# Patient Record
Sex: Female | Born: 1985 | Race: Black or African American | Hispanic: No | Smoking: Current some day smoker
Health system: Southern US, Community
[De-identification: ages and names within clinical notes are randomized; demographics above are authoritative.]

## PROBLEM LIST (undated history)

## (undated) DIAGNOSIS — N83209 Unspecified ovarian cyst, unspecified side: Secondary | ICD-10-CM

## (undated) DIAGNOSIS — O039 Complete or unspecified spontaneous abortion without complication: Secondary | ICD-10-CM

## (undated) HISTORY — PX: NO PAST SURGERIES: SHX2092

---

## 2011-06-25 ENCOUNTER — Emergency Department: Payer: Self-pay | Admitting: Emergency Medicine

## 2011-11-11 ENCOUNTER — Emergency Department: Payer: Self-pay | Admitting: Emergency Medicine

## 2011-11-12 ENCOUNTER — Emergency Department: Payer: Self-pay | Admitting: Emergency Medicine

## 2011-11-12 LAB — CBC
HGB: 13.5 g/dL (ref 12.0–16.0)
MCH: 29.1 pg (ref 26.0–34.0)
MCV: 85 fL (ref 80–100)
Platelet: 206 10*3/uL (ref 150–440)
RBC: 4.63 10*6/uL (ref 3.80–5.20)
RDW: 13.6 % (ref 11.5–14.5)
WBC: 5.2 10*3/uL (ref 3.6–11.0)

## 2011-11-12 LAB — PREGNANCY, URINE: Pregnancy Test, Urine: POSITIVE m[IU]/mL

## 2011-11-12 LAB — COMPREHENSIVE METABOLIC PANEL
Albumin: 4.2 g/dL (ref 3.4–5.0)
Alkaline Phosphatase: 60 U/L (ref 50–136)
Anion Gap: 8 (ref 7–16)
Creatinine: 0.58 mg/dL — ABNORMAL LOW (ref 0.60–1.30)
EGFR (African American): >60
Osmolality: 273 (ref 275–301)
Potassium: 3.5 mmol/L (ref 3.5–5.1)
SGOT(AST): 15 U/L (ref 15–37)
SGPT (ALT): 15 U/L
Sodium: 138 mmol/L (ref 136–145)
Total Protein: 8.4 g/dL — ABNORMAL HIGH (ref 6.4–8.2)

## 2011-11-12 LAB — URINALYSIS, COMPLETE
Bilirubin,UR: NEGATIVE
Nitrite: NEGATIVE
Squamous Epithelial: 3

## 2011-11-12 LAB — LIPASE, BLOOD: Lipase: 53 U/L — ABNORMAL LOW (ref 73–393)

## 2012-07-30 ENCOUNTER — Emergency Department: Payer: Self-pay | Admitting: Emergency Medicine

## 2012-07-30 LAB — CBC
HCT: 43.9 % (ref 35.0–47.0)
MCH: 28.9 pg (ref 26.0–34.0)
MCHC: 33.3 g/dL (ref 32.0–36.0)
MCV: 87 fL (ref 80–100)
Platelet: 233 10*3/uL (ref 150–440)
RBC: 5.06 10*6/uL (ref 3.80–5.20)
RDW: 13.5 % (ref 11.5–14.5)
WBC: 4.3 10*3/uL (ref 3.6–11.0)

## 2012-07-30 LAB — COMPREHENSIVE METABOLIC PANEL
Alkaline Phosphatase: 66 U/L (ref 50–136)
Anion Gap: 7 (ref 7–16)
BUN: 13 mg/dL (ref 7–18)
Bilirubin,Total: 0.5 mg/dL (ref 0.2–1.0)
Chloride: 110 mmol/L — ABNORMAL HIGH (ref 98–107)
Co2: 22 mmol/L (ref 21–32)
Creatinine: 0.75 mg/dL (ref 0.60–1.30)
EGFR (Non-African Amer.): >60
Osmolality: 277 (ref 275–301)
Potassium: 3.9 mmol/L (ref 3.5–5.1)
SGOT(AST): 19 U/L (ref 15–37)
SGPT (ALT): 18 U/L (ref 12–78)
Sodium: 139 mmol/L (ref 136–145)
Total Protein: 8.8 g/dL — ABNORMAL HIGH (ref 6.4–8.2)

## 2012-07-30 LAB — URINALYSIS, COMPLETE
Bilirubin,UR: NEGATIVE
Ketone: NEGATIVE
Nitrite: NEGATIVE
Ph: 6 (ref 4.5–8.0)
Protein: NEGATIVE
RBC,UR: 1 /HPF (ref 0–5)
WBC UR: 7 /HPF (ref 0–5)

## 2012-11-23 ENCOUNTER — Emergency Department: Payer: Self-pay | Admitting: Emergency Medicine

## 2013-10-27 ENCOUNTER — Emergency Department: Payer: Self-pay | Admitting: Emergency Medicine

## 2013-10-27 LAB — URINALYSIS, COMPLETE
Bilirubin,UR: NEGATIVE
Blood: NEGATIVE
GLUCOSE, UR: NEGATIVE mg/dL (ref 0–75)
NITRITE: NEGATIVE
Ph: 6 (ref 4.5–8.0)
Protein: NEGATIVE
Specific Gravity: 1.025 (ref 1.003–1.030)
Squamous Epithelial: 2
WBC UR: 11 /HPF (ref 0–5)

## 2013-10-27 LAB — CBC WITH DIFFERENTIAL/PLATELET
Basophil #: 0 10*3/uL (ref 0.0–0.1)
Basophil %: 0.5 %
EOS ABS: 0.2 10*3/uL (ref 0.0–0.7)
Eosinophil %: 3.9 %
HCT: 40.6 % (ref 35.0–47.0)
HGB: 13.2 g/dL (ref 12.0–16.0)
LYMPHS PCT: 29.8 %
Lymphocyte #: 1.8 10*3/uL (ref 1.0–3.6)
MCH: 28 pg (ref 26.0–34.0)
MCHC: 32.5 g/dL (ref 32.0–36.0)
MCV: 86 fL (ref 80–100)
Monocyte #: 0.7 x10 3/mm (ref 0.2–0.9)
Monocyte %: 10.7 %
Neutrophil #: 3.4 10*3/uL (ref 1.4–6.5)
Neutrophil %: 55.1 %
PLATELETS: 199 10*3/uL (ref 150–440)
RBC: 4.72 10*6/uL (ref 3.80–5.20)
RDW: 14.2 % (ref 11.5–14.5)
WBC: 6.2 10*3/uL (ref 3.6–11.0)

## 2013-10-27 LAB — COMPREHENSIVE METABOLIC PANEL
ALBUMIN: 3.7 g/dL (ref 3.4–5.0)
ALT: 14 U/L (ref 12–78)
ANION GAP: 5 — AB (ref 7–16)
Alkaline Phosphatase: 55 U/L
BILIRUBIN TOTAL: 0.7 mg/dL (ref 0.2–1.0)
BUN: 9 mg/dL (ref 7–18)
CALCIUM: 8.7 mg/dL (ref 8.5–10.1)
CHLORIDE: 107 mmol/L (ref 98–107)
Co2: 25 mmol/L (ref 21–32)
Creatinine: 0.79 mg/dL (ref 0.60–1.30)
EGFR (African American): >60
EGFR (Non-African Amer.): >60
Glucose: 89 mg/dL (ref 65–99)
OSMOLALITY: 272 (ref 275–301)
Potassium: 3.4 mmol/L — ABNORMAL LOW (ref 3.5–5.1)
SGOT(AST): 13 U/L — ABNORMAL LOW (ref 15–37)
Sodium: 137 mmol/L (ref 136–145)
Total Protein: 7.7 g/dL (ref 6.4–8.2)

## 2013-10-27 LAB — LIPASE, BLOOD: LIPASE: 90 U/L (ref 73–393)

## 2013-10-28 LAB — HCG, QUANTITATIVE, PREGNANCY: Beta Hcg, Quant.: <1 m[IU]/mL — ABNORMAL LOW

## 2013-10-28 LAB — WET PREP, GENITAL

## 2013-10-28 LAB — GC/CHLAMYDIA PROBE AMP

## 2013-11-21 ENCOUNTER — Ambulatory Visit: Payer: Self-pay

## 2013-11-21 LAB — URINALYSIS, COMPLETE
BACTERIA: NEGATIVE
Bilirubin,UR: NEGATIVE
Glucose,UR: NEGATIVE mg/dL (ref 0–75)
KETONE: NEGATIVE
Nitrite: NEGATIVE
PH: 7.5 (ref 4.5–8.0)
Protein: NEGATIVE
Specific Gravity: 1.005 (ref 1.003–1.030)

## 2013-11-21 LAB — COMPREHENSIVE METABOLIC PANEL
ALBUMIN: 4 g/dL (ref 3.4–5.0)
ALT: 17 U/L (ref 12–78)
AST: 12 U/L — AB (ref 15–37)
Alkaline Phosphatase: 55 U/L
Anion Gap: 11 (ref 7–16)
BUN: 7 mg/dL (ref 7–18)
Bilirubin,Total: 0.6 mg/dL (ref 0.2–1.0)
CHLORIDE: 103 mmol/L (ref 98–107)
Calcium, Total: 8.8 mg/dL (ref 8.5–10.1)
Co2: 24 mmol/L (ref 21–32)
Creatinine: 0.75 mg/dL (ref 0.60–1.30)
EGFR (African American): >60
EGFR (Non-African Amer.): >60
Glucose: 93 mg/dL (ref 65–99)
Osmolality: 273 (ref 275–301)
Potassium: 3.6 mmol/L (ref 3.5–5.1)
SODIUM: 138 mmol/L (ref 136–145)
Total Protein: 7.8 g/dL (ref 6.4–8.2)

## 2013-11-21 LAB — CBC WITH DIFFERENTIAL/PLATELET
BASOS ABS: 0 10*3/uL (ref 0.0–0.1)
Basophil %: 0.8 %
EOS PCT: 5.7 %
Eosinophil #: 0.3 10*3/uL (ref 0.0–0.7)
HCT: 42.7 % (ref 35.0–47.0)
HGB: 14.1 g/dL (ref 12.0–16.0)
Lymphocyte #: 2.5 10*3/uL (ref 1.0–3.6)
Lymphocyte %: 42.4 %
MCH: 28.4 pg (ref 26.0–34.0)
MCHC: 33 g/dL (ref 32.0–36.0)
MCV: 86 fL (ref 80–100)
MONO ABS: 0.4 x10 3/mm (ref 0.2–0.9)
Monocyte %: 7.4 %
NEUTROS PCT: 43.7 %
Neutrophil #: 2.5 10*3/uL (ref 1.4–6.5)
Platelet: 224 10*3/uL (ref 150–440)
RBC: 4.97 10*6/uL (ref 3.80–5.20)
RDW: 14.4 % (ref 11.5–14.5)
WBC: 5.8 10*3/uL (ref 3.6–11.0)

## 2013-11-21 LAB — WET PREP, GENITAL

## 2013-11-21 LAB — PREGNANCY, URINE: Pregnancy Test, Urine: NEGATIVE m[IU]/mL

## 2013-11-22 LAB — GC/CHLAMYDIA PROBE AMP

## 2013-11-23 ENCOUNTER — Ambulatory Visit: Payer: Self-pay

## 2013-11-23 LAB — URINE CULTURE

## 2015-05-27 ENCOUNTER — Emergency Department
Admission: EM | Admit: 2015-05-27 | Discharge: 2015-05-27 | Disposition: A | Discharge status: Home / Self Care | Payer: Medicaid Other | Attending: Emergency Medicine | Admitting: Emergency Medicine

## 2015-05-27 ENCOUNTER — Encounter: Payer: Self-pay | Admitting: Emergency Medicine

## 2015-05-27 DIAGNOSIS — Z3202 Encounter for pregnancy test, result negative: Secondary | ICD-10-CM | POA: Diagnosis not present

## 2015-05-27 DIAGNOSIS — R1032 Left lower quadrant pain: Secondary | ICD-10-CM | POA: Diagnosis present

## 2015-05-27 DIAGNOSIS — N390 Urinary tract infection, site not specified: Secondary | ICD-10-CM | POA: Diagnosis not present

## 2015-05-27 DIAGNOSIS — N739 Female pelvic inflammatory disease, unspecified: Secondary | ICD-10-CM

## 2015-05-27 DIAGNOSIS — Z720 Tobacco use: Secondary | ICD-10-CM | POA: Diagnosis not present

## 2015-05-27 LAB — URINALYSIS COMPLETE WITH MICROSCOPIC (ARMC ONLY)
BILIRUBIN URINE: NEGATIVE
Glucose, UA: NEGATIVE mg/dL
Ketones, ur: NEGATIVE mg/dL
Nitrite: NEGATIVE
Protein, ur: NEGATIVE mg/dL
SPECIFIC GRAVITY, URINE: 1.014 (ref 1.005–1.030)
pH: 8 (ref 5.0–8.0)

## 2015-05-27 LAB — WET PREP, GENITAL
Clue Cells Wet Prep HPF POC: NONE SEEN
TRICH WET PREP: NONE SEEN
YEAST WET PREP: NONE SEEN

## 2015-05-27 LAB — CHLAMYDIA/NGC RT PCR (ARMC ONLY)
Chlamydia Tr: NOT DETECTED
N gonorrhoeae: NOT DETECTED

## 2015-05-27 LAB — POCT PREGNANCY, URINE: Preg Test, Ur: NEGATIVE

## 2015-05-27 MED ORDER — CEFTRIAXONE SODIUM 1 G IJ SOLR
1.0000 g | Freq: Once | INTRAMUSCULAR | Status: AC
  Administered 2015-05-27: 1 g via INTRAMUSCULAR
  Filled 2015-05-27: qty 10

## 2015-05-27 MED ORDER — CEPHALEXIN 500 MG PO CAPS: 500.0000 mg | ORAL_CAPSULE | Freq: Once | ORAL | Status: DC

## 2015-05-27 MED ORDER — ONDANSETRON 4 MG PO TBDP
4.0000 mg | ORAL_TABLET | Freq: Once | ORAL | Status: AC
  Administered 2015-05-27: 4 mg via ORAL

## 2015-05-27 MED ORDER — AZITHROMYCIN 250 MG PO TABS
1000.0000 mg | ORAL_TABLET | Freq: Once | ORAL | Status: AC
  Administered 2015-05-27: 1000 mg via ORAL
  Filled 2015-05-27: qty 4

## 2015-05-27 MED ORDER — ONDANSETRON 4 MG PO TBDP
ORAL_TABLET | ORAL | Status: AC
  Filled 2015-05-27: qty 1

## 2015-05-27 MED ORDER — ACETAMINOPHEN 325 MG PO TABS
650.0000 mg | ORAL_TABLET | Freq: Once | ORAL | Status: AC
  Administered 2015-05-27: 650 mg via ORAL
  Filled 2015-05-27: qty 2

## 2015-05-27 MED ORDER — CEPHALEXIN 500 MG PO CAPS: 500.0000 mg | ORAL_CAPSULE | Freq: Two times a day (BID) | ORAL | Status: DC

## 2015-05-27 MED ORDER — LIDOCAINE HCL (PF) 1 % IJ SOLN
INTRAMUSCULAR | Status: AC
  Administered 2015-05-27: 2.1 mL
  Filled 2015-05-27: qty 5

## 2015-05-27 NOTE — Discharge Instructions (Signed)
Your evaluate for lower abdominal discomfort, and you aren't being treated for pelvic inflammatory disease and were given antibiotic here in the emergency department for this. You're also being treated for a urinary tract infection. You're being treated with antibiotic called Keflex. Return to the emergency department for any worsening condition including worsening abdominal pain, black or bloody stools, vomiting and cannot keep her medications down, fever, inability to urinate, or any other symptoms concerning to you.   Urinary Tract Infection Urinary tract infections (UTIs) can develop anywhere along your urinary tract. Your urinary tract is your body's drainage system for removing wastes and extra water. Your urinary tract includes two kidneys, two ureters, a bladder, and a urethra. Your kidneys are a pair of bean-shaped organs. Each kidney is about the size of your fist. They are located below your ribs, one on each side of your spine. CAUSES Infections are caused by microbes, which are microscopic organisms, including fungi, viruses, and bacteria. These organisms are so small that they can only be seen through a microscope. Bacteria are the microbes that most commonly cause UTIs. SYMPTOMS  Symptoms of UTIs may vary by age and gender of the patient and by the location of the infection. Symptoms in young women typically include a frequent and intense urge to urinate and a painful, burning feeling in the bladder or urethra during urination. Older women and men are more likely to be tired, shaky, and weak and have muscle aches and abdominal pain. A fever may mean the infection is in your kidneys. Other symptoms of a kidney infection include pain in your back or sides below the ribs, nausea, and vomiting. DIAGNOSIS To diagnose a UTI, your caregiver will ask you about your symptoms. Your caregiver will also ask you to provide a urine sample. The urine sample will be tested for bacteria and white blood  cells. White blood cells are made by your body to help fight infection. TREATMENT  Typically, UTIs can be treated with medication. Because most UTIs are caused by a bacterial infection, they usually can be treated with the use of antibiotics. The choice of antibiotic and length of treatment depend on your symptoms and the type of bacteria causing your infection. HOME CARE INSTRUCTIONS  If you were prescribed antibiotics, take them exactly as your caregiver instructs you. Finish the medication even if you feel better after you have only taken some of the medication.  Drink enough water and fluids to keep your urine clear or pale yellow.  Avoid caffeine, tea, and carbonated beverages. They tend to irritate your bladder.  Empty your bladder often. Avoid holding urine for long periods of time.  Empty your bladder before and after sexual intercourse.  After a bowel movement, women should cleanse from front to back. Use each tissue only once. SEEK MEDICAL CARE IF:   You have back pain.  You develop a fever.  Your symptoms do not begin to resolve within 3 days. SEEK IMMEDIATE MEDICAL CARE IF:   You have severe back pain or lower abdominal pain.  You develop chills.  You have nausea or vomiting.  You have continued burning or discomfort with urination. MAKE SURE YOU:   Understand these instructions.  Will watch your condition.  Will get help right away if you are not doing well or get worse.   This information is not intended to replace advice given to you by your health care provider. Make sure you discuss any questions you have with your health care provider.  Document Released: 04/30/2005 Document Revised: 04/11/2015 Document Reviewed: 08/29/2011 Elsevier Interactive Patient Education 2016 [employer]    Pelvic Inflammatory Disease Pelvic inflammatory disease (PID) is an infection in some or all of the female organs. PID can be in the uterus, ovaries, fallopian tubes,  or the surrounding tissues that are inside the lower belly area (pelvis). PID can lead to lasting problems if it is not treated. To check for this disease, your doctor may:  Do a physical exam.  Do blood tests, urine tests, or a pregnancy test.  Look at your vaginal discharge.  Do tests to look inside the pelvis.  Test you for other infections. HOME CARE  Take over-the-counter and prescription medicines only as told by your doctor.  If you were prescribed an antibiotic medicine, take it as told by your doctor. Do not stop taking it even if you start to feel better.  Do not have sex until treatment is done or as told by your doctor.  Tell your sex partner if you have PID. Your partner may need to be treated.  Keep all follow-up visits as told by your doctor. This is important.  Your doctor may test you for infection again 3 months after you are treated. GET HELP IF:  You have more fluid (discharge) coming from your vagina or fluid that is not normal.  Your pain does not improve.  You throw up (vomit).  You have a fever.  You cannot take your medicines.  Your partner has a sexually transmitted disease (STD).  You have pain when you pee (urinate). GET HELP RIGHT AWAY IF:  You have more belly (abdominal) or lower belly pain.  You have chills.  You are not better after 72 hours.   This information is not intended to replace advice given to you by your health care provider. Make sure you discuss any questions you have with your health care provider.   Document Released: 10/17/2008 Document Revised: 04/11/2015 Document Reviewed: 08/28/2014 Elsevier Interactive Patient Education [employer]

## 2015-05-27 NOTE — ED Notes (Signed)
Pt presents with pain and pressure in her rectum and states she feels like she needs to poop but cant.

## 2015-05-27 NOTE — ED Provider Notes (Signed)
[facility] Emergency Department Provider Note   ____________________________________________  Time seen: 8:25am I have reviewed the triage vital signs and the triage nursing note.  HISTORY  Chief Complaint Abdominal Pain   Historian Patient  HPI Allison Blankenship is a 29 y.o. female who is here for evaluation of lower abdominal pain since last night. She's had some increased frequency of urination and dysuria. Pain is located in the lower abdomen and left side. This morning she had a soft bowel movement which came out easily, but states that it hurts a little bit. No black or bloody stools. No vomiting. No fever. No vaginal discharge.    History reviewed. No pertinent past medical history.  There are no active problems to display for this patient.   History reviewed. No pertinent past surgical history.  Current Outpatient Rx  Name  Route  Sig  Dispense  Refill  . cephALEXin (KEFLEX) 500 MG capsule   Oral   Take 1 capsule (500 mg total) by mouth once.   14 capsule   0     Allergies Review of patient's allergies indicates no known allergies. "cillin" No family history on file.  Social History Social History  Substance Use Topics  . Smoking status: Current Some Day Smoker  . Smokeless tobacco: None  . Alcohol Use: No    Review of Systems  Constitutional: Negative for fever. Eyes: Negative for visual changes. ENT: Negative for sore throat. Cardiovascular: Negative for chest pain. Respiratory: Negative for shortness of breath. Gastrointestinal: Negative for vomiting and diarrhea. Genitourinary: Positive for dysuria. Musculoskeletal: Negative for back pain. Skin: Negative for rash. Neurological: Negative for headache. 10 point Review of Systems otherwise negative ____________________________________________   PHYSICAL EXAM:  VITAL SIGNS: ED Triage Vitals  Enc Vitals Group     BP 05/27/15 0757 130/78 mmHg     Pulse Rate 05/27/15  0757 102     Resp 05/27/15 0757 20     Temp 05/27/15 0757 99.2 F (37.3 C)     Temp Source 05/27/15 0757 Oral     SpO2 05/27/15 0757 92 %     Weight 05/27/15 0751 175 lb (79.379 kg)     Height 05/27/15 0751  (1.626 m)     Head Cir --      Peak Flow --      Pain Score 05/27/15 0751 10     Pain Loc --      Pain Edu? --      Excl. in GC? --      Constitutional: Alert and oriented. Well appearing and in no distress. Eyes: Conjunctivae are normal. PERRL. Normal extraocular movements. ENT   Head: Normocephalic and atraumatic.   Nose: No congestion/rhinnorhea.   Mouth/Throat: Mucous membranes are moist.   Neck: No stridor. Cardiovascular/Chest: Normal rate, regular rhythm.  No murmurs, rubs, or gallops. Respiratory: Normal respiratory effort without tachypnea nor retractions. Breath sounds are clear and equal bilaterally. No wheezes/rales/rhonchi. Gastrointestinal: Soft. No distention, no guarding, no rebound. No McBurney's point tenderness. Suprapubic tenderness mild to moderate and left lower quadrant tenderness mild to moderate.  Genitourinary/rectal: Moderate yellow discharge. Positive cervical motion tenderness. No adnexal mass. Musculoskeletal: Nontender with normal range of motion in all extremities. No joint effusions.  No lower extremity tenderness.  No edema. Neurologic:  Normal speech and language. No gross or focal neurologic deficits are appreciated. Skin:  Skin is warm, dry and intact. No rash noted. Psychiatric: Mood and affect are normal. Speech and behavior are  normal. Patient exhibits appropriate insight and judgment.  ____________________________________________   EKG I, Governor Rooksebecca Ruqaya Strauss, MD, the attending physician have personally viewed and interpreted all ECGs.  No EKG performed ____________________________________________  LABS (pertinent positives/negatives)  Urine pregnancy test negative Urinalysis 1+ leukocytes, 6-30 white blood cells and  rare bacteria. Wet prep:  moderate white blood cells, no clue cells, no trichomoniasis, no yeast ____________________________________________  RADIOLOGY All Xrays were viewed by me. Imaging interpreted by Radiologist.   __________________________________________  PROCEDURES  Procedure(s) performed: None  Critical Care performed: None  ____________________________________________   ED COURSE / ASSESSMENT AND PLAN  CONSULTATIONS: None  Pertinent labs & imaging results that were available during my care of the patient were reviewed by me and considered in my medical decision making (see chart for details).   Patient initially told nurse she had some pain with bowel movement, however for me her pain is suprapubic along with dysuria. Urinalysis is consistent with urinary tract infection.  Pelvic exam consistent with cervicitis/pelvic inflammatory disease. Patient was treated with IM Rocephin and azithromycin here in the emergency Department. Patient was given 1 g IM of Rocephin to cover also for initial UTI. Zithromax 1 g by mouth was given.  Patient / Family / Caregiver informed of clinical course, medical decision-making process, and agree with plan.   I discussed return precautions, follow-up instructions, and discharged instructions with patient and/or family.  ___________________________________________   FINAL CLINICAL IMPRESSION(S) / ED DIAGNOSES   Final diagnoses:  Urinary tract infection without hematuria, site unspecified  Pelvic inflammatory disease       Governor Rooksebecca Eleanor Gatliff, MD 05/27/15 [phone]

## 2015-05-28 LAB — URINE CULTURE

## 2015-07-20 ENCOUNTER — Emergency Department
Admission: EM | Admit: 2015-07-20 | Discharge: 2015-07-20 | Disposition: A | Discharge status: Home / Self Care | Payer: Medicaid Other | Attending: Emergency Medicine | Admitting: Emergency Medicine

## 2015-07-20 ENCOUNTER — Encounter: Payer: Self-pay | Admitting: Emergency Medicine

## 2015-07-20 DIAGNOSIS — T148XXA Other injury of unspecified body region, initial encounter: Secondary | ICD-10-CM

## 2015-07-20 DIAGNOSIS — S20219A Contusion of unspecified front wall of thorax, initial encounter: Secondary | ICD-10-CM | POA: Diagnosis not present

## 2015-07-20 DIAGNOSIS — F17200 Nicotine dependence, unspecified, uncomplicated: Secondary | ICD-10-CM | POA: Diagnosis not present

## 2015-07-20 DIAGNOSIS — Z792 Long term (current) use of antibiotics: Secondary | ICD-10-CM | POA: Insufficient documentation

## 2015-07-20 DIAGNOSIS — Y9389 Activity, other specified: Secondary | ICD-10-CM | POA: Insufficient documentation

## 2015-07-20 DIAGNOSIS — S40011A Contusion of right shoulder, initial encounter: Secondary | ICD-10-CM | POA: Diagnosis not present

## 2015-07-20 DIAGNOSIS — Y92410 Unspecified street and highway as the place of occurrence of the external cause: Secondary | ICD-10-CM | POA: Diagnosis not present

## 2015-07-20 DIAGNOSIS — Y998 Other external cause status: Secondary | ICD-10-CM | POA: Diagnosis not present

## 2015-07-20 DIAGNOSIS — S4991XA Unspecified injury of right shoulder and upper arm, initial encounter: Secondary | ICD-10-CM | POA: Diagnosis present

## 2015-07-20 MED ORDER — IBUPROFEN 800 MG PO TABS: 800.0000 mg | ORAL_TABLET | Freq: Three times a day (TID) | ORAL | Status: DC | PRN

## 2015-07-20 MED ORDER — CYCLOBENZAPRINE HCL 10 MG PO TABS: 10.0000 mg | ORAL_TABLET | Freq: Three times a day (TID) | ORAL | Status: DC | PRN
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
## 2015-07-20 NOTE — Discharge Instructions (Signed)

## 2016-02-06 ENCOUNTER — Encounter: Payer: Self-pay | Admitting: *Deleted

## 2016-02-06 ENCOUNTER — Emergency Department
Admission: EM | Admit: 2016-02-06 | Discharge: 2016-02-06 | Disposition: A | Discharge status: Home / Self Care | Payer: MEDICAID | Attending: Emergency Medicine | Admitting: Emergency Medicine

## 2016-02-06 ENCOUNTER — Emergency Department: Payer: MEDICAID

## 2016-02-06 DIAGNOSIS — N939 Abnormal uterine and vaginal bleeding, unspecified: Secondary | ICD-10-CM

## 2016-02-06 DIAGNOSIS — Z3A01 Less than 8 weeks gestation of pregnancy: Secondary | ICD-10-CM | POA: Insufficient documentation

## 2016-02-06 DIAGNOSIS — O009 Unspecified ectopic pregnancy without intrauterine pregnancy: Secondary | ICD-10-CM | POA: Insufficient documentation

## 2016-02-06 DIAGNOSIS — F17210 Nicotine dependence, cigarettes, uncomplicated: Secondary | ICD-10-CM | POA: Insufficient documentation

## 2016-02-06 LAB — URINALYSIS COMPLETE WITH MICROSCOPIC (ARMC ONLY)
BACTERIA UA: NONE SEEN
Bilirubin Urine: NEGATIVE
GLUCOSE, UA: NEGATIVE mg/dL
Ketones, ur: NEGATIVE mg/dL
LEUKOCYTES UA: NEGATIVE
Nitrite: NEGATIVE
PROTEIN: NEGATIVE mg/dL
Specific Gravity, Urine: 1.013 (ref 1.005–1.030)
pH: 6 (ref 5.0–8.0)

## 2016-02-06 LAB — CBC WITH DIFFERENTIAL/PLATELET
Basophils Absolute: 0 10*3/uL (ref 0–0.1)
EOS ABS: 0.3 10*3/uL (ref 0–0.7)
Eosinophils Relative: 4 %
HCT: 41.5 % (ref 35.0–47.0)
HEMOGLOBIN: 14.1 g/dL (ref 12.0–16.0)
Lymphocytes Relative: 32 %
Lymphs Abs: 2.5 10*3/uL (ref 1.0–3.6)
MCH: 29.8 pg (ref 26.0–34.0)
MCHC: 34.1 g/dL (ref 32.0–36.0)
MCV: 87.3 fL (ref 80.0–100.0)
Monocytes Absolute: 0.9 10*3/uL (ref 0.2–0.9)
Monocytes Relative: 11 %
Neutro Abs: 4.1 10*3/uL (ref 1.4–6.5)
Platelets: ADEQUATE 10*3/uL (ref 150–440)
RBC: 4.75 MIL/uL (ref 3.80–5.20)
RDW: 14.3 % (ref 11.5–14.5)
WBC: 7.8 10*3/uL (ref 3.6–11.0)

## 2016-02-06 LAB — COMPREHENSIVE METABOLIC PANEL
ALBUMIN: 4.5 g/dL (ref 3.5–5.0)
ALK PHOS: 51 U/L (ref 38–126)
ALT: 15 U/L (ref 14–54)
ANION GAP: 9 (ref 5–15)
AST: 19 U/L (ref 15–41)
BUN: 6 mg/dL (ref 6–20)
CALCIUM: 8.9 mg/dL (ref 8.9–10.3)
CHLORIDE: 107 mmol/L (ref 101–111)
CO2: 21 mmol/L — AB (ref 22–32)
Creatinine, Ser: 0.66 mg/dL (ref 0.44–1.00)
GFR calc Af Amer: >60 mL/min (ref >60)
GFR calc non Af Amer: >60 mL/min (ref >60)
Glucose, Bld: 85 mg/dL (ref 65–99)
Potassium: 3.6 mmol/L (ref 3.5–5.1)
SODIUM: 137 mmol/L (ref 135–145)
Total Bilirubin: 1 mg/dL (ref 0.3–1.2)
Total Protein: 7.9 g/dL (ref 6.5–8.1)

## 2016-02-06 LAB — CHLAMYDIA/NGC RT PCR (ARMC ONLY)
Chlamydia Tr: NOT DETECTED
N GONORRHOEAE: NOT DETECTED

## 2016-02-06 LAB — WET PREP, GENITAL
Clue Cells Wet Prep HPF POC: NONE SEEN
Sperm: NONE SEEN
Trich, Wet Prep: NONE SEEN
YEAST WET PREP: NONE SEEN

## 2016-02-06 LAB — HCG, QUANTITATIVE, PREGNANCY: HCG, BETA CHAIN, QUANT, S: 178 m[IU]/mL — AB (ref <5)

## 2016-02-06 LAB — POC URINE PREG, ED: PREG TEST UR: POSITIVE — AB

## 2016-02-06 MED ORDER — ONDANSETRON HCL 4 MG/2ML IJ SOLN
4.0000 mg | Freq: Once | INTRAMUSCULAR | Status: AC
  Administered 2016-02-06: 4 mg via INTRAVENOUS

## 2016-02-06 MED ORDER — MORPHINE SULFATE (PF) 4 MG/ML IV SOLN
4.0000 mg | Freq: Once | INTRAVENOUS | Status: AC
  Administered 2016-02-06: 4 mg via INTRAVENOUS

## 2016-02-06 MED ORDER — MORPHINE SULFATE (PF) 4 MG/ML IV SOLN
INTRAVENOUS | Status: AC
  Administered 2016-02-06: 4 mg via INTRAVENOUS
  Filled 2016-02-06: qty 1

## 2016-02-06 MED ORDER — ONDANSETRON HCL 4 MG/2ML IJ SOLN
INTRAMUSCULAR | Status: AC
  Administered 2016-02-06: 4 mg via INTRAVENOUS
  Filled 2016-02-06: qty 2

## 2016-02-06 NOTE — Discharge Instructions (Signed)

## 2016-02-06 NOTE — ED Notes (Signed)
Pt complains of heavy vaginal bleeding abdominal cramping with clots,pt reports this is abnormal

## 2016-02-06 NOTE — Consult Note (Signed)
OBSTETRICS/GYNECOLOGY CONSULT HISTORY AND PHYSICAL  Reason for Consult: Suspected ectopic pregnancy Referring Physician: Dr. Lenise Arena (ER Physician)  Allison Blankenship is an 30 y.o. G2P1001 female who presented to the Emergency Room with complaints of vaginal bleeding and passage of large clots. Patient reports that she has never had passage of clots before, and so was concerned.  LMP was in June (cannot recall exact date), but was approximately 3-4 weeks ago, however notes that this cycle was different as it only lasted 1 day.  Patient currently denies nausea/vomiting, abdominal pain, abnormal vaginal discharge, fevers or chills.   Pertinent Gynecological History: Menses: flow is moderate, regular every month without intermenstrual spotting and usually lasting less than 6 days Contraception: none currently Blood transfusions: none Sexually transmitted diseases: no past history Previous GYN Procedures: none  Last pap: normal Date: patient thinks it was last year OB History: G2, P1001   Menstrual History: Menarche age: 72  Patient's last menstrual period was 12/21/2015.    History reviewed. No pertinent past medical history.  No past surgical history on file.  No family history on file.  Social History   Social History  . Marital Status: Divorced    Spouse Name: N/A  . Number of Children: N/A  . Years of Education: N/A   Occupational History  . Not on file.   Social History Main Topics  . Smoking status: Current Some Day Smoker -- 1.00 packs/day    Types: Cigarettes  . Smokeless tobacco: Not on file  . Alcohol Use: No  . Drug Use: Not on file  . Sexual Activity: Not on file   Other Topics Concern  . Not on file   Social History Narrative    Allergies: No Known Allergies  Medications: Prior to Admission:  (Not in a hospital admission)  Review of Systems  Constitutional: Negative for fever and chills.  HENT: Negative for congestion and sore throat.    Eyes: Negative.   Respiratory: Negative for cough.   Cardiovascular: Negative for chest pain, palpitations and leg swelling.  Gastrointestinal: Negative for nausea, vomiting, abdominal pain, diarrhea and constipation.  Genitourinary: Negative for dysuria, urgency and frequency.  Musculoskeletal: Negative.   Skin: Negative for itching and rash.  Neurological: Negative.  Negative for dizziness, weakness and headaches.  Endo/Heme/Allergies: Negative.   Psychiatric/Behavioral: Negative.     Blood pressure 139/83, pulse 64, temperature 98.3 F (36.8 C), temperature source Oral, resp. rate 17, height 5' 3" (1.6 m), weight 178 lb (80.74 kg), last menstrual period 12/21/2015, SpO2 98 %. Physical Exam  Constitutional: She is oriented to person, place, and time. She appears well-developed and well-nourished. No distress.  HENT:  Head: Normocephalic and atraumatic.  Eyes: EOM are normal. Pupils are equal, round, and reactive to light. No scleral icterus.  Neck: Normal range of motion. Neck supple. No JVD present. No tracheal deviation present. No thyromegaly present.  Cardiovascular: Normal rate, regular rhythm and normal heart sounds.  Exam reveals no gallop and no friction rub.   No murmur heard. Respiratory: Effort normal and breath sounds normal. No respiratory distress. She has no wheezes.  GI: Soft. Bowel sounds are normal. She exhibits no distension and no mass. There is no tenderness. There is no rebound and no guarding.  Genitourinary:  external genitalia normal, rectovaginal septum normal.  Vagina without discharge, small amount of dark red blood in vault.  Cervix normal appearing, no lesions and no motion tenderness.  Uterus mobile, nontender, normal shape and size.  Adnexae non-palpable,  nontender bilaterally.    Musculoskeletal: Normal range of motion. She exhibits no edema.  Neurological: She is alert and oriented to person, place, and time.  Skin: Skin is warm and dry. She is not  diaphoretic.  Psychiatric: She has a normal mood and affect.    Results for orders placed or performed during the hospital encounter of 02/06/16 (from the past 48 hour(s))  Urinalysis complete, with microscopic     Status: Abnormal   Collection Time: 02/06/16  1:09 PM  Result Value Ref Range   Color, Urine YELLOW (A) YELLOW   APPearance CLEAR (A) CLEAR   Glucose, UA NEGATIVE NEGATIVE mg/dL   Bilirubin Urine NEGATIVE NEGATIVE   Ketones, ur NEGATIVE NEGATIVE mg/dL   Specific Gravity, Urine 1.013 1.005 - 1.030   Hgb urine dipstick 2+ (A) NEGATIVE   pH 6.0 5.0 - 8.0   Protein, ur NEGATIVE NEGATIVE mg/dL   Nitrite NEGATIVE NEGATIVE   Leukocytes, UA NEGATIVE NEGATIVE   RBC / HPF 0-5 0 - 5 RBC/hpf   WBC, UA 0-5 0 - 5 WBC/hpf   Bacteria, UA NONE SEEN NONE SEEN   Squamous Epithelial / LPF 0-5 (A) NONE SEEN   Mucous PRESENT   POC urine preg     Status: Abnormal   Collection Time: 02/06/16  1:09 PM  Result Value Ref Range   Preg Test, Ur Positive (A) Negative  CBC with Differential     Status: None   Collection Time: 02/06/16  1:25 PM  Result Value Ref Range   WBC 7.8 3.6 - 11.0 K/uL   RBC 4.75 3.80 - 5.20 MIL/uL   Hemoglobin 14.1 12.0 - 16.0 g/dL   HCT 41.5 35.0 - 47.0 %   MCV 87.3 80.0 - 100.0 fL   MCH 29.8 26.0 - 34.0 pg   MCHC 34.1 32.0 - 36.0 g/dL   RDW 14.3 11.5 - 14.5 %   Platelets  150 - 440 K/uL    PLATELET CLUMPS NOTED ON SMEAR, COUNT APPEARS ADEQUATE   Neutrophils Relative % 52% %   Neutro Abs 4.1 1.4 - 6.5 K/uL   Lymphocytes Relative 32% %   Lymphs Abs 2.5 1.0 - 3.6 K/uL   Monocytes Relative 11% %   Monocytes Absolute 0.9 0.2 - 0.9 K/uL   Eosinophils Relative 4% %   Eosinophils Absolute 0.3 0 - 0.7 K/uL   Basophils Relative 1% %   Basophils Absolute 0.0 0 - 0.1 K/uL  Comprehensive metabolic panel     Status: Abnormal   Collection Time: 02/06/16  1:25 PM  Result Value Ref Range   Sodium 137 135 - 145 mmol/L   Potassium 3.6 3.5 - 5.1 mmol/L   Chloride 107 101 -  111 mmol/L   CO2 21 (L) 22 - 32 mmol/L   Glucose, Bld 85 65 - 99 mg/dL   BUN 6 6 - 20 mg/dL   Creatinine, Ser 0.66 0.44 - 1.00 mg/dL   Calcium 8.9 8.9 - 10.3 mg/dL   Total Protein 7.9 6.5 - 8.1 g/dL   Albumin 4.5 3.5 - 5.0 g/dL   AST 19 15 - 41 U/L   ALT 15 14 - 54 U/L   Alkaline Phosphatase 51 38 - 126 U/L   Total Bilirubin 1.0 0.3 - 1.2 mg/dL   GFR calc non Af Amer >60 >60 mL/min   GFR calc Af Amer >60 >60 mL/min    Comment: (NOTE) The eGFR has been calculated using the CKD EPI equation. This calculation has  not been validated in all clinical situations. eGFR's persistently <60 mL/min signify possible Chronic Kidney Disease.    Anion gap 9 5 - 15  Wet prep, genital     Status: Abnormal   Collection Time: 02/06/16  1:25 PM  Result Value Ref Range   Yeast Wet Prep HPF POC NONE SEEN NONE SEEN   Trich, Wet Prep NONE SEEN NONE SEEN   Clue Cells Wet Prep HPF POC NONE SEEN NONE SEEN   WBC, Wet Prep HPF POC FEW (A) NONE SEEN   Sperm NONE SEEN   Chlamydia/NGC rt PCR ([facility] only)     Status: None   Collection Time: 02/06/16  1:25 PM  Result Value Ref Range   Specimen source GC/Chlam ENDOCERVICAL    Chlamydia Tr NOT DETECTED NOT DETECTED   N gonorrhoeae NOT DETECTED NOT DETECTED    Comment: (NOTE) 100  This methodology has not been evaluated in pregnant women or in 200  patients with a history of hysterectomy. 300 400  This methodology will not be performed on patients less than 84  years of age.   hCG, quantitative, pregnancy     Status: Abnormal   Collection Time: 02/06/16  1:25 PM  Result Value Ref Range   hCG, Beta Chain, Quant, S 178 (H) <5 mIU/mL    Comment:          GEST. AGE      CONC.  (mIU/mL)   <=1 WEEK        5 - 50     2 WEEKS       50 - 500     3 WEEKS       100 - 10,000     4 WEEKS     1,000 - 30,000     5 WEEKS     3,500 - 115,000   6-8 WEEKS     12,000 - 270,000    12 WEEKS     15,000 - 220,000        FEMALE AND NON-PREGNANT FEMALE:     LESS THAN 5  mIU/mL     US Ob Comp Less 14 Wks  02/06/2016  CLINICAL DATA:  Abdominal pain and vaginal bleeding. First-trimester pregnancy. Quantitative beta HCG 178. Gestational age [redacted] weeks 5 days. EXAM: OBSTETRIC <14 WK [place] AND TRANSVAGINAL OB US TECHNIQUE: Both transabdominal and transvaginal ultrasound examinations were performed for complete evaluation of the gestation as well as the maternal uterus, adnexal regions, and pelvic cul-de-sac. Transvaginal technique was performed to assess early pregnancy. COMPARISON:  11/23/2013 FINDINGS: No intrauterine gestational sac or fluid. There is an echogenic peripherally hypervascular mass in the right adnexa that is extra ovarian, 23 x 15 x 15 mm. Mass has no identifiable internal fetal structures. Symmetric normal appearance of the ovaries. No free pelvic fluid. These results were called by telephone at the time of interpretation on 02/06/2016 at 2:51 pm to Dr. Lenise Arena , who verbally acknowledged these results. IMPRESSION: Extra ovarian right adnexal mass with no intrauterine sac, concerning for ectopic pregnancy. At the current serum beta HCG level, early intrauterine pregnancy cannot be excluded. No pelvic fluid. Electronically Signed   By: Monte Fantasia M.D.   On: 02/06/2016 14:52   US Ob Transvaginal  02/06/2016  CLINICAL DATA:  Abdominal pain and vaginal bleeding. First-trimester pregnancy. Quantitative beta HCG 178. Gestational age [redacted] weeks 5 days. EXAM: OBSTETRIC <14 WK [place] AND TRANSVAGINAL OB US TECHNIQUE: Both transabdominal and transvaginal ultrasound examinations were performed  for complete evaluation of the gestation as well as the maternal uterus, adnexal regions, and pelvic cul-de-sac. Transvaginal technique was performed to assess early pregnancy. COMPARISON:  11/23/2013 FINDINGS: No intrauterine gestational sac or fluid. There is an echogenic peripherally hypervascular mass in the right adnexa that is extra ovarian, 23 x 15 x 15 mm. Mass has no  identifiable internal fetal structures. Symmetric normal appearance of the ovaries. No free pelvic fluid. These results were called by telephone at the time of interpretation on 02/06/2016 at 2:51 pm to Dr. Lenise Arena , who verbally acknowledged these results. IMPRESSION: Extra ovarian right adnexal mass with no intrauterine sac, concerning for ectopic pregnancy. At the current serum beta HCG level, early intrauterine pregnancy cannot be excluded. No pelvic fluid. Electronically Signed   By: Monte Fantasia M.D.   On: 02/06/2016 14:52    Assessment/Plan: 1. Pregnancy, cannot r/o ectopic - Patient with low BHCG (158), vaginal bleeding.  Could be early IUP with miscarriage vs cannot r/o ectopic (based on small right adnexal mass, 2 cm).  Due to low levels, unable to identify an IUP on ultrasound. Based on symptoms, I believe this is more likely an early pregnancy (with suspected miscarriage).   Discussion had with patient regarding differential diagnosis.  Per patient, she was unaware of pregnancy until today, however pregnancy is desired.  Would recommend repeating BHCG in 48 hrs.  If levels are declining, no further management needed as pregnancy will likely resolve without further intervention.  If levels rising, would recommend serial HCGs q 2-3 days until levels reach at least 1500 (if normal rise).  At this point, would then repeat scan.  Would strongly reiterate ectopic and bleeding precautions if HCG levels continued to rise.   If abnormal rise, suspicion rises for ectopic pregnancy and patient would be a candidate for Methotrexate therapy.  - Patient to f/u in clinic in 2 days for repeat BHCG.  Will notify patient of results once they return, and inform of further follow up as indicated by labs.  - Would recommend to draw ABO and Rh to assess need for Rhogam.  - Vaginal bleeding, currently hemodynamically stable.     Rubie Maid 02/06/2016

## 2016-02-06 NOTE — ED Provider Notes (Signed)
[facility] Emergency Department Provider Note        Time seen: ----------------------------------------- 1:21 PM on 02/06/2016 -----------------------------------------    I have reviewed the triage vital signs and the nursing notes.   HISTORY  Chief Complaint Vaginal Bleeding    HPI Allison Blankenship is a 30 y.o. female who presents to ER for heavy vaginal bleeding and abdominal cramping with clots today. Patient reports this is abnormal, she states in June she had a one-day menstrual cycle which is unusual for her. Patient has never passed large clots before. She denies fevers, chills or other complaints. Currently she does not have significant abdominal pain or bleeding.   History reviewed. No pertinent past medical history.  There are no active problems to display for this patient.   No past surgical history on file.  Allergies Review of patient's allergies indicates no known allergies.  Social History Social History  Substance Use Topics  . Smoking status: Current Some Day Smoker -- 1.00 packs/day    Types: Cigarettes  . Smokeless tobacco: None  . Alcohol Use: No    Review of Systems Constitutional: Negative for fever. Cardiovascular: Negative for chest pain. Respiratory: Negative for shortness of breath. Gastrointestinal: Positive for abdominal cramping Genitourinary: Negative for dysuria.Positive for vaginal bleeding Musculoskeletal: Negative for back pain. Skin: Negative for rash. Neurological: Negative for headaches, focal weakness or numbness.  10-point ROS otherwise negative.  ____________________________________________   PHYSICAL EXAM:  VITAL SIGNS: ED Triage Vitals  Enc Vitals Group     BP 02/06/16 1213 154/81 mmHg     Pulse Rate 02/06/16 1213 76     Resp 02/06/16 1213 18     Temp 02/06/16 1213 98.3 F (36.8 C)     Temp Source 02/06/16 1213 Oral     SpO2 02/06/16 1213 98 %     Weight 02/06/16 1213 178 lb (80.74  kg)     Height 02/06/16 1213 5\' 3"  (1.6 m)     Head Cir --      Peak Flow --      Pain Score 02/06/16 1207 8     Pain Loc --      Pain Edu? --      Excl. in GC? --     Constitutional: Alert and oriented. Well appearing and in no distress. Eyes: Conjunctivae are normal. PERRL. Normal extraocular movements. ENT   Head: Normocephalic and atraumatic.   Nose: No congestion/rhinnorhea.   Mouth/Throat: Mucous membranes are moist.   Neck: No stridor. Cardiovascular: Normal rate, regular rhythm. No murmurs, rubs, or gallops. Respiratory: Normal respiratory effort without tachypnea nor retractions. Breath sounds are clear and equal bilaterally. No wheezes/rales/rhonchi. Gastrointestinal: Soft and nontender. Normal bowel sounds Genitourinary: Active vaginal bleeding that is mild, clots and tissue present in the cervix. Musculoskeletal: Nontender with normal range of motion in all extremities. No lower extremity tenderness nor edema. Neurologic:  Normal speech and language. No gross focal neurologic deficits are appreciated.  Skin:  Skin is warm, dry and intact. No rash noted. Psychiatric: Mood and affect are normal. Speech and behavior are normal.  ____________________________________________  ED COURSE:  Pertinent labs & imaging results that were available during my care of the patient were reviewed by me and considered in my medical decision making (see chart for details). Patient is in no acute distress, we will assess with basic labs and ultrasound if needed. ____________________________________________    LABS (pertinent positives/negatives)  Labs Reviewed  WET PREP, GENITAL - Abnormal; Notable for  the following:    WBC, Wet Prep HPF POC FEW (*)    All other components within normal limits  COMPREHENSIVE METABOLIC PANEL - Abnormal; Notable for the following:    CO2 21 (*)    All other components within normal limits  URINALYSIS COMPLETEWITH MICROSCOPIC ([facility] ONLY) -  Abnormal; Notable for the following:    Color, Urine YELLOW (*)    APPearance CLEAR (*)    Hgb urine dipstick 2+ (*)    Squamous Epithelial / LPF 0-5 (*)    All other components within normal limits  HCG, QUANTITATIVE, PREGNANCY - Abnormal; Notable for the following:    hCG, Beta Chain, Quant, S 178 (*)    All other components within normal limits  POC URINE PREG, ED - Abnormal; Notable for the following:    Preg Test, Ur Positive (*)    All other components within normal limits  CHLAMYDIA/NGC RT PCR ([facility] ONLY)  CBC WITH DIFFERENTIAL/PLATELET  POC URINE PREG, ED    RADIOLOGY Images were viewed by me  Pregnancy ultrasound  IMPRESSION: Extra ovarian right adnexal mass with no intrauterine sac, concerning for ectopic pregnancy. At the current serum beta HCG level, early intrauterine pregnancy cannot be excluded. No pelvic fluid. ____________________________________________  FINAL ASSESSMENT AND PLAN  Vaginal bleeding in early pregnancy, possible ectopic pregnancy  Plan: Patient with labs and imaging as dictated above. Ultrasound findings as above, I will discuss with OB/GYN.  OB/GYN, Dr. Valentino Saxonherry will come and evaluate the patient in the ER for final disposition. Emily FilbertWilliams, Khara Renaud E, MD   Note: This dictation was prepared with Dragon dictation. Any transcriptional errors that result from this process are unintentional   Emily FilbertJonathan E Nashaly Dorantes, MD 02/06/16 [phone]

## 2016-02-07 ENCOUNTER — Encounter: Payer: Self-pay | Admitting: Emergency Medicine

## 2016-02-07 ENCOUNTER — Emergency Department
Admission: EM | Admit: 2016-02-07 | Discharge: 2016-02-07 | Discharge status: Against Medical Advice | Payer: MEDICAID | Attending: Emergency Medicine | Admitting: Emergency Medicine

## 2016-02-07 DIAGNOSIS — Z5320 Procedure and treatment not carried out because of patient's decision for unspecified reasons: Secondary | ICD-10-CM

## 2016-02-07 DIAGNOSIS — N939 Abnormal uterine and vaginal bleeding, unspecified: Secondary | ICD-10-CM | POA: Insufficient documentation

## 2016-02-07 DIAGNOSIS — Z5329 Procedure and treatment not carried out because of patient's decision for other reasons: Secondary | ICD-10-CM

## 2016-02-07 DIAGNOSIS — F17210 Nicotine dependence, cigarettes, uncomplicated: Secondary | ICD-10-CM | POA: Insufficient documentation

## 2016-02-07 NOTE — ED Notes (Signed)
Patient presents to the ED with vaginal bleeding and left lower quadrant abdominal pain.  Patient was seen in the ED yesterday and told she may have an ectopic pregnancy and was instructed to follow-up with OB-GYN tomorrow.  Patient states the pain has increased and is very severe and her bleeding is very heavy.  Patient is in no obvious distress at this time.

## 2016-02-07 NOTE — ED Notes (Signed)
Pt left AMA, unable to obtain pt signature.  MD aware.

## 2016-02-07 NOTE — ED Notes (Signed)
Pt in via triage with complaints of pelvic pain, vaginal bleeding.  Pt reports being seen here last night and being dx with an ectopic pregnancy with instructions to follow up with OB/GYN.  Pt reports pain and bleeding is worse today, reports saturating 5 pads w/in the last hour, passing large blood clots.  Pt A/Ox4, vitals WDL, no immediate distress at this time.

## 2016-02-07 NOTE — ED Provider Notes (Signed)
-----------------------------------------   2:51 PM on 02/07/2016 -----------------------------------------   Blood pressure 158/94, pulse 89, temperature 99 F (37.2 C), temperature source Oral, resp. rate 18, last menstrual period 12/21/2015, SpO2 99 %.  I went in to evaluate the patient and she seemed to be already agitated that the nursing staff at suspected that she did not have an ectopic pregnancy. Had a short conversation with the patient she was determined that [facility]. I advised her that we could repeat her testing here today due to her concerns of heavy vaginal bleeding and passing large clots. She is hemodynamically stable and had no syncopal episode. Sensation states that she still wants to leave AGAINST MEDICAL ADVICE.Marland Kitchen.   Jennye MoccasinBrian S Bernisha Verma, MD 02/07/16 1452

## 2016-02-08 ENCOUNTER — Other Ambulatory Visit: Payer: Self-pay

## 2016-02-08 ENCOUNTER — Encounter: Payer: Self-pay | Admitting: Obstetrics and Gynecology

## 2016-02-08 DIAGNOSIS — O009 Unspecified ectopic pregnancy without intrauterine pregnancy: Secondary | ICD-10-CM

## 2016-02-09 LAB — BETA HCG QUANT (REF LAB): hCG Quant: 66 m[IU]/mL

## 2016-02-11 ENCOUNTER — Telehealth: Payer: Self-pay

## 2016-02-11 NOTE — Telephone Encounter (Signed)
-----   Message from Hildred LaserAnika Cherry, MD sent at 02/09/2016 12:22 PM EDT ----- Please inform patient that it looks as though she does not have an ectopic pregnancy, but instead is completing a miscarriage as her BHCG levels continue to trend downward). She will need a repeat BHCG in 4-5 days to make sure it has gone down to negative value.

## 2016-02-11 NOTE — Telephone Encounter (Signed)
Called pt no answer, Lm for pt to call back.

## 2016-02-11 NOTE — Telephone Encounter (Signed)
Called pt informed her of the need for repeat Beta and miscarriage, pt gave verbal understanding.

## 2016-07-06 ENCOUNTER — Emergency Department
Admission: EM | Admit: 2016-07-06 | Discharge: 2016-07-06 | Disposition: A | Discharge status: Home / Self Care | Payer: Medicaid Other | Attending: Emergency Medicine | Admitting: Emergency Medicine

## 2016-07-06 ENCOUNTER — Encounter: Payer: Self-pay | Admitting: *Deleted

## 2016-07-06 DIAGNOSIS — Z202 Contact with and (suspected) exposure to infections with a predominantly sexual mode of transmission: Secondary | ICD-10-CM | POA: Diagnosis not present

## 2016-07-06 DIAGNOSIS — N3000 Acute cystitis without hematuria: Secondary | ICD-10-CM | POA: Diagnosis not present

## 2016-07-06 DIAGNOSIS — F17210 Nicotine dependence, cigarettes, uncomplicated: Secondary | ICD-10-CM | POA: Diagnosis not present

## 2016-07-06 DIAGNOSIS — R102 Pelvic and perineal pain: Secondary | ICD-10-CM

## 2016-07-06 DIAGNOSIS — Z791 Long term (current) use of non-steroidal anti-inflammatories (NSAID): Secondary | ICD-10-CM | POA: Diagnosis not present

## 2016-07-06 DIAGNOSIS — R1030 Lower abdominal pain, unspecified: Secondary | ICD-10-CM | POA: Diagnosis present

## 2016-07-06 HISTORY — DX: Unspecified ovarian cyst, unspecified side: N83.209

## 2016-07-06 LAB — COMPREHENSIVE METABOLIC PANEL
ALBUMIN: 4.4 g/dL (ref 3.5–5.0)
ALT: 12 U/L — ABNORMAL LOW (ref 14–54)
ANION GAP: 10 (ref 5–15)
AST: 16 U/L (ref 15–41)
Alkaline Phosphatase: 56 U/L (ref 38–126)
BILIRUBIN TOTAL: 1.2 mg/dL (ref 0.3–1.2)
BUN: 11 mg/dL (ref 6–20)
CHLORIDE: 109 mmol/L (ref 101–111)
CO2: 19 mmol/L — ABNORMAL LOW (ref 22–32)
Calcium: 8.7 mg/dL — ABNORMAL LOW (ref 8.9–10.3)
Creatinine, Ser: 0.73 mg/dL (ref 0.44–1.00)
GFR calc Af Amer: >60 mL/min (ref >60)
Glucose, Bld: 90 mg/dL (ref 65–99)
POTASSIUM: 3.4 mmol/L — AB (ref 3.5–5.1)
Sodium: 138 mmol/L (ref 135–145)
TOTAL PROTEIN: 8.3 g/dL — AB (ref 6.5–8.1)

## 2016-07-06 LAB — URINALYSIS COMPLETE WITH MICROSCOPIC (ARMC ONLY)
Bacteria, UA: NONE SEEN
Bilirubin Urine: NEGATIVE
Glucose, UA: NEGATIVE mg/dL
Hgb urine dipstick: NEGATIVE
Nitrite: NEGATIVE
Protein, ur: NEGATIVE mg/dL
Specific Gravity, Urine: 1.027 (ref 1.005–1.030)
pH: 5 (ref 5.0–8.0)

## 2016-07-06 LAB — CBC
HEMATOCRIT: 41.2 % (ref 35.0–47.0)
HEMOGLOBIN: 14.4 g/dL (ref 12.0–16.0)
MCH: 30.1 pg (ref 26.0–34.0)
MCHC: 34.9 g/dL (ref 32.0–36.0)
MCV: 86.2 fL (ref 80.0–100.0)
Platelets: 227 10*3/uL (ref 150–440)
RBC: 4.77 MIL/uL (ref 3.80–5.20)
RDW: 14.3 % (ref 11.5–14.5)
WBC: 6.5 10*3/uL (ref 3.6–11.0)

## 2016-07-06 MED ORDER — CIPROFLOXACIN HCL 500 MG PO TABS: 500.0000 mg | ORAL_TABLET | Freq: Two times a day (BID) | ORAL | 0 refills | Status: DC

## 2016-07-06 MED ORDER — CEFTRIAXONE SODIUM 1 G IJ SOLR
500.0000 mg | Freq: Once | INTRAMUSCULAR | Status: AC
  Administered 2016-07-06: 500 mg via INTRAMUSCULAR
  Filled 2016-07-06 (×2): qty 10

## 2016-07-06 MED ORDER — LIDOCAINE HCL (PF) 1 % IJ SOLN
5.0000 mL | Freq: Once | INTRAMUSCULAR | Status: AC
  Administered 2016-07-06: 5 mL via INTRADERMAL
  Filled 2016-07-06: qty 5

## 2016-07-06 NOTE — ED Triage Notes (Signed)
States hx of ovarian cyst, states right lower abd pain for 1 week with some vaginal spotting

## 2016-07-06 NOTE — ED Provider Notes (Signed)
[facility] Emergency Department Provider Note  ____________________________________________  Time seen: Approximately 4:05 PM  I have reviewed the triage vital signs and the nursing notes.   HISTORY  Chief Complaint Abdominal Pain    HPI Allison Blankenship is a 30 y.o. female who presents to the emergency department for evaluation of lower abdominal pain. She states that pain is similar to a previous ovarian cyst. She also states that her husband tested positive for "one of the STDs." She denies dysuria, but states that she has pain with intercourse.  Past Medical History:  Diagnosis Date  . Ovarian cyst     There are no active problems to display for this patient.   History reviewed. No pertinent surgical history.  Prior to Admission medications   Medication Sig Start Date End Date Taking? Authorizing Provider  ciprofloxacin (CIPRO) 500 MG tablet Take 1 tablet (500 mg total) by mouth 2 (two) times daily. 07/06/16   Chinita Pesterari B Vladislav Axelson, FNP  cyclobenzaprine (FLEXERIL) 10 MG tablet Take 1 tablet (10 mg total) by mouth every 8 (eight) hours as needed for muscle spasms. 07/20/15   Charmayne Sheerharles M Beers, PA-C  ibuprofen (ADVIL,MOTRIN) 800 MG tablet Take 1 tablet (800 mg total) by mouth every 8 (eight) hours as needed. 07/20/15   Evangeline Dakinharles M Beers, PA-C    Allergies Patient has no known allergies.  No family history on file.  Social History Social History  Substance Use Topics  . Smoking status: Current Some Day Smoker    Packs/day: 1.00    Types: Cigarettes  . Smokeless tobacco: Not on file  . Alcohol use No    Review of Systems Constitutional: Negative for fever. Respiratory: Negative for shortness of breath or cough. Gastrointestinal: Positive for abdominal pain; negative for nausea , Negative for vomiting. Genitourinary: Negative for dysuria , negative for vaginal discharge. Musculoskeletal: Negative for back pain. Skin: Negative for rash, lesion,  wound. ____________________________________________   PHYSICAL EXAM:  VITAL SIGNS: ED Triage Vitals  Enc Vitals Group     BP 07/06/16 1515 130/77     Pulse Rate 07/06/16 1515 92     Resp 07/06/16 1515 18     Temp 07/06/16 1515 99.2 F (37.3 C)     Temp Source 07/06/16 1515 Oral     SpO2 07/06/16 1515 100 %     Weight 07/06/16 1513 185 lb (83.9 kg)     Height 07/06/16 1513 5\' 3"  (1.6 m)     Head Circumference --      Peak Flow --      Pain Score 07/06/16 1513 5     Pain Loc --      Pain Edu? --      Excl. in GC? --     Constitutional: Alert and oriented. Well appearing and in no acute distress. Eyes: Conjunctivae are normal. PERRL. EOMI. Head: Atraumatic. Nose: No congestion/rhinnorhea. Mouth/Throat: Mucous membranes are moist. Respiratory: Normal respiratory effort.  No retractions. Gastrointestinal: Suprapubic tenderness on exam. No guarding or rebound. No distension.  Genitourinary: Pelvic exam: deferred. Musculoskeletal: No extremity tenderness nor edema.  Neurologic:  Normal speech and language. No gross focal neurologic deficits are appreciated. Speech is normal. No gait instability. Skin:  Skin is warm, dry and intact. No rash noted. Psychiatric: Mood and affect are normal. Speech and behavior are normal.  ____________________________________________   LABS (all labs ordered are listed, but only abnormal results are displayed)  Labs Reviewed  COMPREHENSIVE METABOLIC PANEL - Abnormal; Notable for the  following:       Result Value   Potassium 3.4 (*)    CO2 19 (*)    Calcium 8.7 (*)    Total Protein 8.3 (*)    ALT 12 (*)    All other components within normal limits  URINALYSIS COMPLETEWITH MICROSCOPIC ([facility] ONLY) - Abnormal; Notable for the following:    Color, Urine YELLOW (*)    APPearance CLEAR (*)    Ketones, ur TRACE (*)    Leukocytes, UA 1+ (*)    Squamous Epithelial / LPF 0-5 (*)    All other components within normal limits  CBC  POC URINE  PREG, ED   ____________________________________________  RADIOLOGY  Not indicated. ____________________________________________   PROCEDURES  Procedure(s) performed: None  ____________________________________________   INITIAL IMPRESSION / ASSESSMENT AND PLAN / ED COURSE  Clinical Course      Pertinent labs & imaging results that were available during my care of the patient were reviewed by me and considered in my medical decision making (see chart for details).  Pelvic exam deferred as patient has known STD exposure she will be treated with Rocephin and azithromycin. So complete a 3 day course of Cipro for urinary tract infection. She was advised to follow-up with her primary care provider, gynecology, or the health department for symptoms that do not improve over the next couple of days. She was advised to return to the emergency department for abdominal pain that gets worse or if her symptoms change. ____________________________________________   FINAL CLINICAL IMPRESSION(S) / ED DIAGNOSES  Final diagnoses:  Pelvic pain in female  Acute cystitis without hematuria  STD exposure    Note:  This document was prepared using Dragon voice recognition software and may include unintentional dictation errors.    Chinita PesterCari B Yolander Goodie, FNP 07/06/16 1737    Jene Everyobert Kinner, MD 07/07/16 [phone]

## 2016-07-06 NOTE — ED Notes (Signed)
Developed some pain to RLQ about 1 week ago  Pain is non radiating  Hx of ovarian cyst and thinks she may have another  Also has had some vaginal discharge  Denies any fever or urinary sx's

## 2017-02-10 ENCOUNTER — Emergency Department
Admission: EM | Admit: 2017-02-10 | Discharge: 2017-02-10 | Disposition: A | Discharge status: Home / Self Care | Payer: Medicaid Other | Attending: Emergency Medicine | Admitting: Emergency Medicine

## 2017-02-10 ENCOUNTER — Emergency Department: Payer: Medicaid Other

## 2017-02-10 ENCOUNTER — Encounter: Payer: Self-pay | Admitting: Emergency Medicine

## 2017-02-10 DIAGNOSIS — F17210 Nicotine dependence, cigarettes, uncomplicated: Secondary | ICD-10-CM | POA: Diagnosis not present

## 2017-02-10 DIAGNOSIS — Z79899 Other long term (current) drug therapy: Secondary | ICD-10-CM | POA: Diagnosis not present

## 2017-02-10 DIAGNOSIS — Z8759 Personal history of other complications of pregnancy, childbirth and the puerperium: Secondary | ICD-10-CM | POA: Diagnosis not present

## 2017-02-10 DIAGNOSIS — O4690 Antepartum hemorrhage, unspecified, unspecified trimester: Secondary | ICD-10-CM

## 2017-02-10 DIAGNOSIS — O209 Hemorrhage in early pregnancy, unspecified: Secondary | ICD-10-CM

## 2017-02-10 DIAGNOSIS — O009 Unspecified ectopic pregnancy without intrauterine pregnancy: Secondary | ICD-10-CM

## 2017-02-10 DIAGNOSIS — O208 Other hemorrhage in early pregnancy: Secondary | ICD-10-CM

## 2017-02-10 DIAGNOSIS — O4691 Antepartum hemorrhage, unspecified, first trimester: Secondary | ICD-10-CM | POA: Diagnosis not present

## 2017-02-10 DIAGNOSIS — Z3A00 Weeks of gestation of pregnancy not specified: Secondary | ICD-10-CM | POA: Diagnosis not present

## 2017-02-10 DIAGNOSIS — O3680X0 Pregnancy with inconclusive fetal viability, not applicable or unspecified: Secondary | ICD-10-CM | POA: Insufficient documentation

## 2017-02-10 DIAGNOSIS — O200 Threatened abortion: Secondary | ICD-10-CM | POA: Diagnosis present

## 2017-02-10 HISTORY — DX: Complete or unspecified spontaneous abortion without complication: O03.9

## 2017-02-10 LAB — CBC WITH DIFFERENTIAL/PLATELET
BASOS ABS: 0.1 10*3/uL (ref 0–0.1)
Basophils Relative: 1 %
EOS PCT: 3 %
Eosinophils Absolute: 0.2 10*3/uL (ref 0–0.7)
HEMATOCRIT: 37.8 % (ref 35.0–47.0)
Hemoglobin: 13 g/dL (ref 12.0–16.0)
LYMPHS PCT: 37 %
Lymphs Abs: 2.5 10*3/uL (ref 1.0–3.6)
MCH: 29.7 pg (ref 26.0–34.0)
MCHC: 34.4 g/dL (ref 32.0–36.0)
MCV: 86.3 fL (ref 80.0–100.0)
MONOS PCT: 6 %
Monocytes Absolute: 0.4 10*3/uL (ref 0.2–0.9)
NEUTROS ABS: 3.6 10*3/uL (ref 1.4–6.5)
Neutrophils Relative %: 53 %
PLATELETS: 235 10*3/uL (ref 150–440)
RBC: 4.38 MIL/uL (ref 3.80–5.20)
RDW: 14.5 % (ref 11.5–14.5)
WBC: 6.9 10*3/uL (ref 3.6–11.0)

## 2017-02-10 LAB — URINALYSIS, COMPLETE (UACMP) WITH MICROSCOPIC
Bacteria, UA: NONE SEEN
Bilirubin Urine: NEGATIVE
GLUCOSE, UA: NEGATIVE mg/dL
KETONES UR: 5 mg/dL — AB
Leukocytes, UA: NEGATIVE
NITRITE: NEGATIVE
PH: 5 (ref 5.0–8.0)
PROTEIN: 100 mg/dL — AB
Specific Gravity, Urine: 1.031 — ABNORMAL HIGH (ref 1.005–1.030)

## 2017-02-10 LAB — HCG, QUANTITATIVE, PREGNANCY: hCG, Beta Chain, Quant, S: 319 m[IU]/mL — ABNORMAL HIGH (ref <5)

## 2017-02-10 LAB — ABO/RH: ABO/RH(D): A POS

## 2017-02-10 LAB — POCT PREGNANCY, URINE: Preg Test, Ur: POSITIVE — AB

## 2017-02-10 MED ORDER — METHOTREXATE SODIUM CHEMO INJECTION 250 MG/10ML
50.0000 mg/m2 | Freq: Once | INTRAMUSCULAR | Status: DC
  Filled 2017-02-10 (×2): qty 4

## 2017-02-10 MED ORDER — METHOTREXATE SODIUM CHEMO INJECTION 250 MG/10ML
50.0000 mg/m2 | Freq: Once | INTRAMUSCULAR | Status: DC
  Filled 2017-02-10: qty 4

## 2017-02-10 MED ORDER — METHOTREXATE SODIUM CHEMO INJECTION 250 MG/10ML: 50.0000 mg/m2 | Freq: Once | INTRAMUSCULAR | Status: DC

## 2017-02-10 NOTE — Consult Note (Signed)
GYNECOLOGY CONSULT HISTORY AND PHYSICAL   Reason for Consult: suspected ectopic pregnancy, vaginal bleeding Referring Physician: Dr. Gladstone Pih (ER Physician)  Allison Blankenship is an 31 y.o. [phone] female presented to the Emergency Room with complaints of vaginal bleeding and passage of clots and possible tissue. Bleeding began this morning and was assocated with pelvic cramping (relieved by Motrin), and rectal pressure (states that it felt like she needed to have a bowel movement and just couldn't go).  Patient's last menstrual period was 01/29/2017. She notes regular menstrual cycles.  Denies nausea/vomiting, dysuria, constipation.   Of note, patient presented to the ER with similar symptoms last year, and was diagnosed with a miscarriage.  She also had a small right ovarian cyst that was noted (and had also been seen on another previous ultrasound).   Pertinent Gynecological History: Menses: flow is moderate, regular every month without intermenstrual spotting and usually lasting less than 6 days Contraception: none currently Blood transfusions: none Sexually transmitted diseases: no past history Previous GYN Procedures: none  Last pap: normal Date: this year, done by PCP at [facility]  Menstrual History: Menarche age: 33 Patient's last menstrual period was 01/29/2017.     OB History  Gravida Para Term Preterm AB Living  4 2 2   1 2   SAB TAB Ectopic Multiple Live Births  1       2    # Outcome Date GA Lbr Len/2nd Weight Sex Delivery Anes PTL Lv  4 Current           3 SAB 02/06/16 [redacted]w[redacted]d         2 Term 2008 [redacted]w[redacted]d  5 lb 11 oz (2.58 kg) F Vag-Spont   LIV  1 Term 2007 [redacted]w[redacted]d  8 lb 13 oz (3.997 kg) M Vag-Spont  N LIV      Past Medical History:  Diagnosis Date  . Miscarriage   . Ovarian cyst     Past Surgical History:  Procedure Laterality Date  . NO PAST SURGERIES      Family History  Problem Relation Age of Onset  . Cancer Neg Hx   . Thyroid disease Neg Hx    . Kidney disease Neg Hx     Social History   Social History  . Marital status: Divorced    Spouse name: N/A  . Number of children: N/A  . Years of education: N/A   Occupational History  . Not on file.   Social History Main Topics  . Smoking status: Current Some Day Smoker    Packs/day: 1.00    Types: Cigarettes  . Smokeless tobacco: Never Used  . Alcohol use No  . Drug use: Unknown  . Sexual activity: Not on file   Other Topics Concern  . Not on file   Social History Narrative  . No narrative on file    Allergies:  Allergies  Allergen Reactions  . Amoxicillin Hives    Medications: Prior to Admission:   No current facility-administered medications on file prior to encounter.    Current Outpatient Prescriptions on File Prior to Encounter  Medication Sig Dispense Refill  . cyclobenzaprine (FLEXERIL) 10 MG tablet Take 1 tablet (10 mg total) by mouth every 8 (eight) hours as needed for muscle spasms. (Patient not taking: Reported on 02/10/2017) 30 tablet 1  . ibuprofen (ADVIL,MOTRIN) 800 MG tablet Take 1 tablet (800 mg total) by mouth every 8 (eight) hours as needed. (Patient not taking: Reported on 02/10/2017) 30 tablet 0  ROS Review of Systems  Constitutional: Negative for fever and chills.  HENT: Negative for congestion and sore throat.   Eyes: Negative.   Respiratory: Negative for cough.   Cardiovascular: Negative for chest pain, palpitations and leg swelling.  Gastrointestinal: Negative for nausea, vomiting, abdominal pain, diarrhea and constipation.  Genitourinary: Negative for dysuria, urgency and frequency.  + for vaginal bleeding.  Musculoskeletal: Negative.   Skin: Negative for itching and rash.  Neurological: Negative.  Negative for dizziness, weakness and headaches.  Endo/Heme/Allergies: Negative.   Psychiatric/Behavioral: Negative.    Blood pressure (!) 110/42, pulse 68, temperature 99.5 F (37.5 C), temperature source Oral, resp. rate 20,  height 5\' 4"  (1.626 m), weight 197 lb (89.4 kg), last menstrual period 01/29/2017, SpO2 100 %, unknown if currently breastfeeding. Physical Exam Constitutional: She is oriented to person, place, and time. She appears well-developed and well-nourished. No distress.  HENT:  Head: Normocephalic and atraumatic.  Eyes: EOM are normal. Pupils are equal, round, and reactive to light. No scleral icterus.  Neck: Normal range of motion. Neck supple. No JVD present. No tracheal deviation present. No thyromegaly present.  Cardiovascular: Normal rate, regular rhythm and normal heart sounds.  Exam reveals no gallop and no friction rub.   No murmur heard. Respiratory: Effort normal and breath sounds normal. No respiratory distress. She has no wheezes.  GI: Soft. Bowel sounds are normal. She exhibits no distension and no mass. There is no tenderness. There is no rebound and no guarding.  Genitourinary: external genitalia normal, rectovaginal septum normal.  Vagina without discharge, small amount of dark red blood in vault.  Cervix normal appearing, no lesions and no motion tenderness, os closed.  Uterus mobile, nontender, normal shape and size.  Adnexae non-palpable, nontender bilaterally.  Musculoskeletal: Normal range of motion. She exhibits no edema.  Neurological: She is alert and oriented to person, place, and time.  Skin: Skin is warm and dry. She is not diaphoretic.  Psychiatric: She has a normal mood and affect.     Results for orders placed or performed during the hospital encounter of 02/10/17 (from the past 48 hour(s))  Pregnancy, urine POC     Status: Abnormal   Collection Time: 02/10/17 11:08 AM  Result Value Ref Range   Preg Test, Ur POSITIVE (A) NEGATIVE    Comment:        THE SENSITIVITY OF THIS METHODOLOGY IS >24 mIU/mL   hCG, quantitative, pregnancy     Status: Abnormal   Collection Time: 02/10/17 11:10 AM  Result Value Ref Range   hCG, Beta Chain, Quant, S 319 (H) <5 mIU/mL     Comment:          GEST. AGE      CONC.  (mIU/mL)   <=1 WEEK        5 - 50     2 WEEKS       50 - 500     3 WEEKS       100 - 10,000     4 WEEKS     1,000 - 30,000     5 WEEKS     3,500 - 115,000   6-8 WEEKS     12,000 - 270,000    12 WEEKS     15,000 - 220,000        FEMALE AND NON-PREGNANT FEMALE:     LESS THAN 5 mIU/mL   ABO/Rh     Status: None   Collection Time: 02/10/17 11:10 AM  Result Value Ref Range   ABO/RH(D) A POS   Urinalysis, Complete w Microscopic     Status: Abnormal   Collection Time: 02/10/17 11:11 AM  Result Value Ref Range   Color, Urine YELLOW (A) YELLOW   APPearance TURBID (A) CLEAR   Specific Gravity, Urine 1.031 (H) 1.005 - 1.030   pH 5.0 5.0 - 8.0   Glucose, UA NEGATIVE NEGATIVE mg/dL   Hgb urine dipstick MODERATE (A) NEGATIVE   Bilirubin Urine NEGATIVE NEGATIVE   Ketones, ur 5 (A) NEGATIVE mg/dL   Protein, ur 161 (A) NEGATIVE mg/dL   Nitrite NEGATIVE NEGATIVE   Leukocytes, UA NEGATIVE NEGATIVE   RBC / HPF TOO NUMEROUS TO COUNT 0 - 5 RBC/hpf   WBC, UA TOO NUMEROUS TO COUNT 0 - 5 WBC/hpf   Bacteria, UA NONE SEEN NONE SEEN   Squamous Epithelial / LPF 6-30 (A) NONE SEEN   Mucous PRESENT   CBC with Differential     Status: None   Collection Time: 02/10/17  3:08 PM  Result Value Ref Range   WBC 6.9 3.6 - 11.0 K/uL   RBC 4.38 3.80 - 5.20 MIL/uL   Hemoglobin 13.0 12.0 - 16.0 g/dL   HCT 09.6 04.5 - 40.9 %   MCV 86.3 80.0 - 100.0 fL   MCH 29.7 26.0 - 34.0 pg   MCHC 34.4 32.0 - 36.0 g/dL   RDW 81.1 91.4 - 78.2 %   Platelets 235 150 - 440 K/uL   Neutrophils Relative % 53 %   Neutro Abs 3.6 1.4 - 6.5 K/uL   Lymphocytes Relative 37 %   Lymphs Abs 2.5 1.0 - 3.6 K/uL   Monocytes Relative 6 %   Monocytes Absolute 0.4 0.2 - 0.9 K/uL   Eosinophils Relative 3 %   Eosinophils Absolute 0.2 0 - 0.7 K/uL   Basophils Relative 1 %   Basophils Absolute 0.1 0 - 0.1 K/uL    US Ob Comp Less 14 Wks  Result Date: 02/10/2017 CLINICAL DATA:  Vaginal bleeding and  cramping with positive serum pregnancy test. Previous right-sided ectopic gestation EXAM: OBSTETRIC <14 WK [place] AND TRANSVAGINAL OB US TECHNIQUE: Both transabdominal and transvaginal ultrasound examinations were performed for complete evaluation of the gestation as well as the maternal uterus, adnexal regions, and pelvic cul-de-sac. Transvaginal technique was performed to assess early pregnancy. COMPARISON:  February 06, 2016 FINDINGS: Intrauterine gestational sac: Not visualized Yolk sac:  Not visualized Embryo:  Not visualized Cardiac Activity: Not visualized Subchorionic hemorrhage:  None visualized. Maternal uterus/ovaries: No intrauterine mass. There is a complex appearing right adnexal mass which contains a sac-like area concerning for extrauterine gestation. An extrauterine fetus is currently not seen. Left ovary appears unremarkable. There is a small amount of free pelvic fluid. IMPRESSION: No intrauterine gestation or intrauterine mass. There is a right adnexal soft tissue mass with sac-like area within this lesion. The mass measures approximately 1.5 x 1.5 cm in this area. This lesion is similar to the lesions seen 1 year prior. The appearance is suspicious for ectopic gestation on the right. Small amount of nearby free pelvic fluid noted. Note that an actual extrauterine gestation is not seen. Given this concern for ectopic gestation, gynecologic consultation is urged. Critical Value/emergent results were called by telephone at the time of interpretation on 02/10/2017 at 2:03 pm to Dr. Gladstone Pih , who verbally acknowledged these results. Electronically Signed   By: Bretta Bang III M.D.   On: 02/10/2017 14:04   US Ob  Transvaginal  Result Date: 02/10/2017 CLINICAL DATA:  Vaginal bleeding and cramping with positive serum pregnancy test. Previous right-sided ectopic gestation EXAM: OBSTETRIC <14 WK US AND TRANSVAGINAL OB US TECHNIQUE: Both transabdominal and transvaginal ultrasound examinations were  performed for complete evaluation of the gestation as well as the maternal uterus, adnexal regions, and pelvic cul-de-sac. Transvaginal technique was performed to assess early pregnancy. COMPARISON:  February 06, 2016 FINDINGS: Intrauterine gestational sac: Not visualized Yolk sac:  Not visualized Embryo:  Not visualized Cardiac Activity: Not visualized Subchorionic hemorrhage:  None visualized. Maternal uterus/ovaries: No intrauterine mass. There is a complex appearing right adnexal mass which contains a sac-like area concerning for extrauterine gestation. An extrauterine fetus is currently not seen. Left ovary appears unremarkable. There is a small amount of free pelvic fluid. IMPRESSION: No intrauterine gestation or intrauterine mass. There is a right adnexal soft tissue mass with sac-like area within this lesion. The mass measures approximately 1.5 x 1.5 cm in this area. This lesion is similar to the lesions seen 1 year prior. The appearance is suspicious for ectopic gestation on the right. Small amount of nearby free pelvic fluid noted. Note that an actual extrauterine gestation is not seen. Given this concern for ectopic gestation, gynecologic consultation is urged. Critical Value/emergent results were called by telephone at the time of interpretation on 02/10/2017 at 2:03 pm to Dr. Gladstone PihAVID SCHAEVITZ , who verbally acknowledged these results. Electronically Signed   By: Bretta BangWilliam  Woodruff III M.D.   On: 02/10/2017 14:04    Assessment/Plan: 1. Pregnancy, cannot r/o ectopic - Patient with low BHCG (319), vaginal bleeding.  Could be early IUP with miscarriage vs cannot r/o ectopic (based on small right adnexal mass, ~2 cm, however was also seen on ultrasound last year).  Due to low levels, unable to identify an IUP on ultrasound. Based on symptoms, I believe this is more likely an early pregnancy (with suspected miscarriage).   Discussion had with patient regarding differential diagnosis.  Per patient, she would like  to manage expectantly.  I recommend repeating BHCG in 48 hrs.  If levels are declining, no further management needed as pregnancy will likely resolve without further intervention.  If levels rising, would recommend serial HCGs q 2-3 days until levels reach at least 1500 (if normal rise).  At this point, would then repeat scan.  Would strongly reiterate ectopic and bleeding precautions if HCG levels continued to rise.   If abnormal rise, suspicion rises for ectopic pregnancy and patient would be a candidate for Methotrexate therapy.  - Patient to f/u in clinic in 2 days for repeat BHCG.  Will notify patient of results once they return, and inform of further follow up as indicated by labs.  - Vaginal bleeding, light, currently hemodynamically stable.      Hildred Laserherry, Nickolette Espinola, MD [facility]

## 2017-02-10 NOTE — ED Notes (Signed)
Per ED MD, OB consulting physician to decide whether or not pt needs Methotrexate injection.  Will wait for consult.

## 2017-02-10 NOTE — ED Notes (Signed)
OB consulting physician to bedside at this time.

## 2017-02-10 NOTE — ED Notes (Signed)
Pelvic Cart set up at bedside. 

## 2017-02-10 NOTE — ED Provider Notes (Signed)
[facility] Emergency Department Provider Note  ____________________________________________   First MD Initiated Contact with Patient 02/10/17 1207     (approximate)  I have reviewed the triage vital signs and the nursing notes.   HISTORY  Chief Complaint Vaginal Bleeding   HPI Allison Blankenship is a 31 y.o. female with a history of a miscarriage as well as ovarian cyst was present in the emergency department today with rectal pain. She says that she is also having vaginal bleeding with clots and tissue passage. She says this tissue and bleeding started this morning. Says he is a very similar symptoms to when she miscarried last year. She is denying any burning with urination. Has had nausea but no vomiting. Says the pain is mild-to-moderate at this time.   Past Medical History:  Diagnosis Date  . Miscarriage   . Ovarian cyst     There are no active problems to display for this patient.   History reviewed. No pertinent surgical history.  Prior to Admission medications   Medication Sig Start Date End Date Taking? Authorizing Provider  ciprofloxacin (CIPRO) 500 MG tablet Take 1 tablet (500 mg total) by mouth 2 (two) times daily. 07/06/16   Triplett, Rulon Eisenmengerari B, FNP  cyclobenzaprine (FLEXERIL) 10 MG tablet Take 1 tablet (10 mg total) by mouth every 8 (eight) hours as needed for muscle spasms. 07/20/15   Beers, Charmayne Sheerharles M, PA-C  ibuprofen (ADVIL,MOTRIN) 800 MG tablet Take 1 tablet (800 mg total) by mouth every 8 (eight) hours as needed. 07/20/15   Beers, Charmayne Sheerharles M, PA-C    Allergies Amoxicillin  No family history on file.  Social History Social History  Substance Use Topics  . Smoking status: Current Some Day Smoker    Packs/day: 1.00    Types: Cigarettes  . Smokeless tobacco: Never Used  . Alcohol use No    Review of Systems  Constitutional: No fever/chills Eyes: No visual changes. ENT: No sore throat. Cardiovascular: Denies chest  pain. Respiratory: Denies shortness of breath. Gastrointestinal: No abdominal pain.  No nausea, no vomiting.  No diarrhea.  No constipation. Genitourinary: Negative for dysuria. Musculoskeletal: Negative for back pain. Skin: Negative for rash. Neurological: Negative for headaches, focal weakness or numbness.   ____________________________________________   PHYSICAL EXAM:  VITAL SIGNS: ED Triage Vitals  Enc Vitals Group     BP 02/10/17 1059 (!) 141/91     Pulse Rate 02/10/17 1059 83     Resp 02/10/17 1059 20     Temp 02/10/17 1059 99.5 F (37.5 C)     Temp Source 02/10/17 1059 Oral     SpO2 02/10/17 1059 99 %     Weight 02/10/17 1059 197 lb (89.4 kg)     Height 02/10/17 1059 5\' 4"  (1.626 m)     Head Circumference --      Peak Flow --      Pain Score 02/10/17 1107 5     Pain Loc --      Pain Edu? --      Excl. in GC? --     Constitutional: Alert and oriented. Well appearing and in no acute distress. Eyes: Conjunctivae are normal.  Head: Atraumatic. Nose: No congestion/rhinnorhea. Mouth/Throat: Mucous membranes are moist.  Neck: No stridor.   Cardiovascular: Normal rate, regular rhythm. Grossly normal heart sounds.   Respiratory: Normal respiratory effort.  No retractions. Lungs CTAB. Gastrointestinal: Soft and nontender. No distention.  Musculoskeletal: No lower extremity tenderness nor edema.  No joint effusions.  Neurologic:  Normal speech and language. No gross focal neurologic deficits are appreciated. Skin:  Skin is warm, dry and intact. No rash noted. Psychiatric: Mood and affect are normal. Speech and behavior are normal.  ____________________________________________   LABS (all labs ordered are listed, but only abnormal results are displayed)  Labs Reviewed  HCG, QUANTITATIVE, PREGNANCY - Abnormal; Notable for the following:       Result Value   hCG, Beta Chain, Quant, S 319 (*)    All other components within normal limits  URINALYSIS, COMPLETE (UACMP)  WITH MICROSCOPIC - Abnormal; Notable for the following:    Color, Urine YELLOW (*)    APPearance TURBID (*)    Specific Gravity, Urine 1.031 (*)    Hgb urine dipstick MODERATE (*)    Ketones, ur 5 (*)    Protein, ur 100 (*)    Squamous Epithelial / LPF 6-30 (*)    All other components within normal limits  POCT PREGNANCY, URINE - Abnormal; Notable for the following:    Preg Test, Ur POSITIVE (*)    All other components within normal limits  CHLAMYDIA/NGC RT PCR ([facility] ONLY)  WET PREP, GENITAL  POC URINE PREG, ED  ABO/RH   ____________________________________________  EKG   ____________________________________________  RADIOLOGY  Suspected non-ruptured right-sided ectopic. ____________________________________________   PROCEDURES  Procedure(s) performed:   Procedures  Critical Care performed:   ____________________________________________   INITIAL IMPRESSION / ASSESSMENT AND PLAN / ED COURSE  Pertinent labs & imaging results that were available during my care of the patient were reviewed by me and considered in my medical decision making (see chart for details).    Clinical Course as of Feb 10 1533  Tue Feb 10, 2017  1443 Paged 2  to the [facility] [facility] and the second page was returned by Dr. Dalbert Garnet who says that she is not on call today. However, she says that she will notify the provider on-call, Dr.Schermerhorn, to contact to the emergency department.  [DS]    Clinical Course User Index [DS] Myrna Blazer, MD   ----------------------------------------- 3:35 PM on 02/10/2017 -----------------------------------------  I discussed case with Dr.Schermerhorn of OB/GYN who reports that Dr. Valentino Saxon of encompasses seen the patient in the past. Discussed case with Dr. Valentino Saxon who says to order the weight-based dosing of methotrexate and that she will be over shortly to evaluate the patient. I discussed with Dr. Valentino Saxon that I did not do a pelvic  exam on the patient because the patient result came back prior to me being able to the pelvic exam as the patient went to ultrasound for wound repair to the pelvic. She is aware. Patient was also updated about her ultrasound finding and that OB/GYN will be evaluating her. Signed out to Dr. Alphonzo Lemmings.  ____________________________________________   FINAL CLINICAL IMPRESSION(S) / ED DIAGNOSES  Final diagnoses:  Vaginal bleeding affecting early pregnancy  Ectopic pregnancy.    NEW MEDICATIONS STARTED DURING THIS VISIT:  New Prescriptions   No medications on file     Note:  This document was prepared using Dragon voice recognition software and may include unintentional dictation errors.     Myrna Blazer, MD 02/10/17 1537

## 2017-02-10 NOTE — ED Triage Notes (Signed)
Says onset vaginal bleeding with clots this am.  Says she was trying to get pregnant.  Pain pelvic area and into buttocks.

## 2017-02-10 NOTE — ED Provider Notes (Signed)
-----------------------------------------   5:09 PM on 02/10/2017 -----------------------------------------  Patient seen and evaluated by Dr. Valentino Saxonherry of OB/GYN. They have elected to discharge the patient as there is no definitive proof of ectopic given a residual nature of this cystic lesion that they see from prior ultrasound. Patient would prefer expectant management. Abdomen is benign, no evidence of instability or significant bleeding, return precautions bleeding precautions and follow-up given and understood she will follow-up with Dr. Valentino Saxonherry in 2 days for repeat Quant and she knows that if she has increased pain or significant bleeding she must return to the emergency department. Patient very With this plan we will discharge her based on the plan of her OB/GYN.   Jeanmarie PlantMcShane, James A, MD 02/10/17 1710

## 2017-02-10 NOTE — Discharge Instructions (Signed)
It is possibly have an early pregnancy, threatened miscarriage, or that you have an ectopic pregnancy. You and your OB/GYN would like to evaluate this further as an outpatient which is not unreasonable. With this means is that if you have increased pain, significant bleeding more than a pad an hour, lightheadedness or  you feel worse in any way you  must return immediately to the emergency department. Otherwise do not fail to follow-up in 48 hours with Dr. Valentino Saxonherry for repeat blood work.

## 2017-02-10 NOTE — ED Notes (Signed)
Patient transported to Ultrasound 

## 2017-02-11 ENCOUNTER — Telehealth: Payer: Self-pay | Admitting: Emergency Medicine

## 2017-02-11 ENCOUNTER — Telehealth: Payer: Self-pay | Admitting: Obstetrics and Gynecology

## 2017-02-11 ENCOUNTER — Other Ambulatory Visit: Payer: Self-pay

## 2017-02-11 DIAGNOSIS — O208 Other hemorrhage in early pregnancy: Principal | ICD-10-CM

## 2017-02-11 DIAGNOSIS — O209 Hemorrhage in early pregnancy, unspecified: Secondary | ICD-10-CM

## 2017-02-11 NOTE — Telephone Encounter (Signed)
Pt calling to question prescription status, reviewed chart and on reviewing no prescription were indicated or written. Pt verbalized "you all did not communicate well, and I am an intelligent person and would of understand that but you all asked what pharmacy and assumed you sent the prescription there"

## 2017-02-11 NOTE — Telephone Encounter (Signed)
Patient was seen in ED yesterday by Dr. Valentino Saxonherry, Patient needs to come in for f/u labs. I called and lvm for patient to call back and schedule appointment.

## 2017-02-12 ENCOUNTER — Other Ambulatory Visit: Payer: Medicaid Other

## 2017-02-12 DIAGNOSIS — O209 Hemorrhage in early pregnancy, unspecified: Secondary | ICD-10-CM

## 2017-02-12 DIAGNOSIS — O208 Other hemorrhage in early pregnancy: Principal | ICD-10-CM

## 2017-02-13 LAB — BETA HCG QUANT (REF LAB): HCG QUANT: 213 m[IU]/mL

## 2017-02-16 ENCOUNTER — Telehealth: Payer: Self-pay

## 2017-02-16 ENCOUNTER — Telehealth: Payer: Self-pay | Admitting: Obstetrics and Gynecology

## 2017-02-16 DIAGNOSIS — O021 Missed abortion: Secondary | ICD-10-CM

## 2017-02-16 NOTE — Telephone Encounter (Signed)
Called pt, no answer. LM for pt informing her of decreasing beta and the need to come in this Thursday for a repeat beta.

## 2017-02-16 NOTE — Telephone Encounter (Signed)
-----   Message from Allison LaserAnika Cherry, MD sent at 02/16/2017  5:15 AM EDT ----- Please inform patient that her hcg levels have decreased some (most consistent with miscarriage).  Would recommend returning again for 1 final level on Thursday to ensure that it continues to decline.

## 2017-02-16 NOTE — Telephone Encounter (Signed)
Spoke with patient, She stated that her voicemail is full and she will call back to get her beta results. Thank you.

## 2017-02-24 ENCOUNTER — Encounter: Payer: Medicaid Other | Admitting: Obstetrics and Gynecology

## 2017-02-25 ENCOUNTER — Other Ambulatory Visit: Payer: Medicaid Other

## 2017-02-25 ENCOUNTER — Other Ambulatory Visit: Payer: Self-pay

## 2017-02-25 ENCOUNTER — Encounter: Payer: Medicaid Other | Admitting: Obstetrics and Gynecology

## 2017-02-25 DIAGNOSIS — O021 Missed abortion: Secondary | ICD-10-CM

## 2017-02-26 ENCOUNTER — Other Ambulatory Visit: Payer: Self-pay

## 2017-02-26 DIAGNOSIS — O021 Missed abortion: Secondary | ICD-10-CM

## 2017-02-26 LAB — BETA HCG QUANT (REF LAB): hCG Quant: 135 m[IU]/mL

## 2017-03-04 ENCOUNTER — Other Ambulatory Visit: Payer: Medicaid Other

## 2017-03-04 DIAGNOSIS — O021 Missed abortion: Secondary | ICD-10-CM

## 2017-03-05 LAB — BETA HCG QUANT (REF LAB): hCG Quant: 25 m[IU]/mL

## 2017-03-09 ENCOUNTER — Telehealth: Payer: Self-pay

## 2017-03-09 NOTE — Telephone Encounter (Signed)
Called pt informed her of Beta levels and that there is no need for further f/u. Pt voiced understanding.

## 2017-03-09 NOTE — Telephone Encounter (Signed)
-----   Message from Hildred LaserAnika Cherry, MD sent at 03/07/2017  3:40 PM EDT ----- Please inform that her labs are trending down appropriately.  She does not require any further follow up.

## 2017-08-30 ENCOUNTER — Encounter: Payer: Self-pay | Admitting: Emergency Medicine

## 2017-08-30 ENCOUNTER — Emergency Department
Admission: EM | Admit: 2017-08-30 | Discharge: 2017-08-30 | Disposition: A | Discharge status: Home / Self Care | Payer: Medicaid Other | Attending: Emergency Medicine | Admitting: Emergency Medicine

## 2017-08-30 DIAGNOSIS — S161XXA Strain of muscle, fascia and tendon at neck level, initial encounter: Secondary | ICD-10-CM | POA: Insufficient documentation

## 2017-08-30 DIAGNOSIS — Y9389 Activity, other specified: Secondary | ICD-10-CM | POA: Insufficient documentation

## 2017-08-30 DIAGNOSIS — Y92410 Unspecified street and highway as the place of occurrence of the external cause: Secondary | ICD-10-CM | POA: Diagnosis not present

## 2017-08-30 DIAGNOSIS — S199XXA Unspecified injury of neck, initial encounter: Secondary | ICD-10-CM | POA: Diagnosis present

## 2017-08-30 DIAGNOSIS — Y999 Unspecified external cause status: Secondary | ICD-10-CM | POA: Insufficient documentation

## 2017-08-30 DIAGNOSIS — F17210 Nicotine dependence, cigarettes, uncomplicated: Secondary | ICD-10-CM | POA: Insufficient documentation

## 2017-08-30 MED ORDER — IBUPROFEN 600 MG PO TABS: 600.0000 mg | ORAL_TABLET | Freq: Four times a day (QID) | ORAL | 0 refills | Status: AC | PRN

## 2017-08-30 MED ORDER — CYCLOBENZAPRINE HCL 5 MG PO TABS: 5.0000 mg | ORAL_TABLET | Freq: Three times a day (TID) | ORAL | 0 refills | Status: AC | PRN

## 2017-08-30 NOTE — Discharge Instructions (Signed)
Please alternate Tylenol and ibuprofen as needed for pain.  Take Flexeril as needed for muscle spasms and tightness.  Please follow-up primary care provider if no improvement 1 week.  Return to the emergency department for any worsening symptoms or urgent changes in her health.
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
## 2017-08-31 ENCOUNTER — Emergency Department
Admission: EM | Admit: 2017-08-31 | Discharge: 2017-08-31 | Disposition: A | Discharge status: Home / Self Care | Payer: Medicaid Other | Attending: Emergency Medicine | Admitting: Emergency Medicine

## 2017-08-31 ENCOUNTER — Other Ambulatory Visit: Payer: Self-pay

## 2017-08-31 ENCOUNTER — Encounter: Payer: Self-pay | Admitting: Emergency Medicine

## 2017-08-31 DIAGNOSIS — M791 Myalgia, unspecified site: Secondary | ICD-10-CM | POA: Diagnosis not present

## 2017-08-31 DIAGNOSIS — M545 Low back pain: Secondary | ICD-10-CM | POA: Insufficient documentation

## 2017-08-31 DIAGNOSIS — F17210 Nicotine dependence, cigarettes, uncomplicated: Secondary | ICD-10-CM | POA: Diagnosis not present

## 2017-08-31 DIAGNOSIS — Z041 Encounter for examination and observation following transport accident: Secondary | ICD-10-CM | POA: Insufficient documentation
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
## 2017-11-22 IMAGING — US US OB TRANSVAGINAL
1 series · 14 of 28 positions shown · non-contrast
Comparison: 11/23/2013

CLINICAL DATA: Abdominal pain and vaginal bleeding. First-trimester
pregnancy. Quantitative beta HCG 178. Gestational age 6 weeks 5
days.

EXAM:
OBSTETRIC <14 WK US AND TRANSVAGINAL OB US
TECHNIQUE: Both transabdominal and transvaginal ultrasound examinations were
performed for complete evaluation of the gestation as well as the
maternal uterus, adnexal regions, and pelvic cul-de-sac.
Transvaginal technique was performed to assess early pregnancy.

[Series 1: us ob transvaginal · 0.23mm/px · 80 acquisitions, 14 frames shown]
[im 3/80]
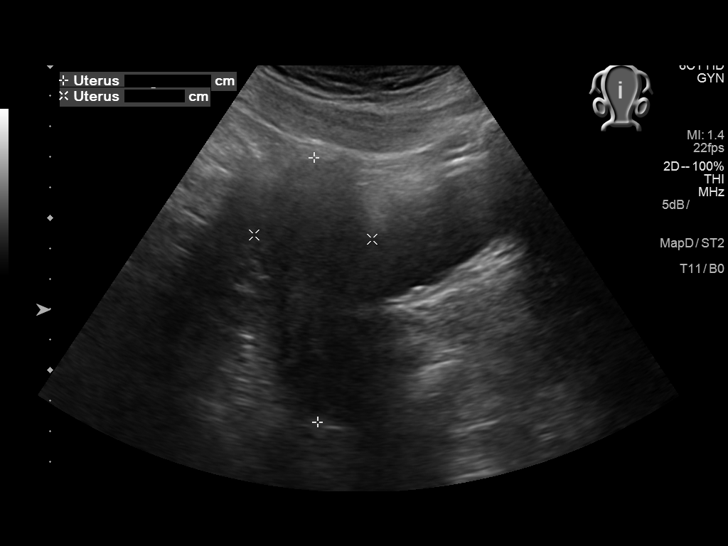
[im 9/80]
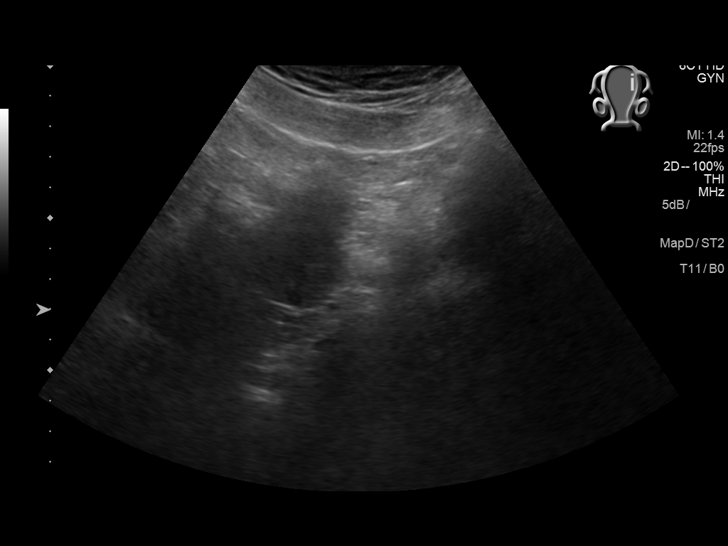
[im 15/80]
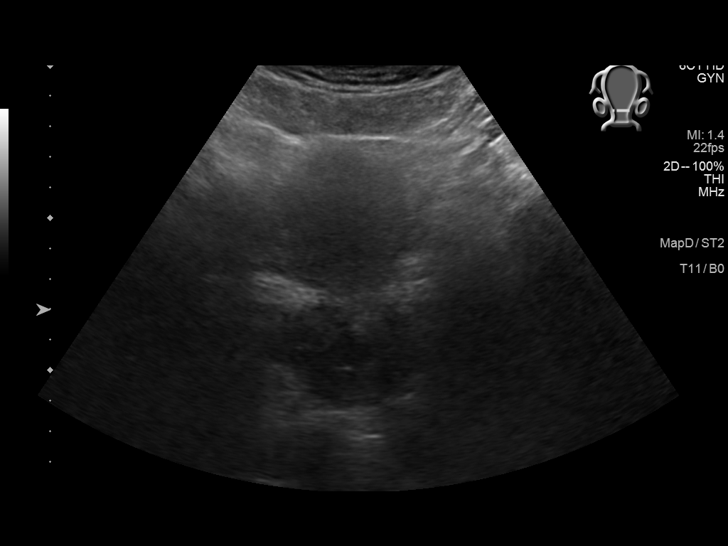
[im 21/80]
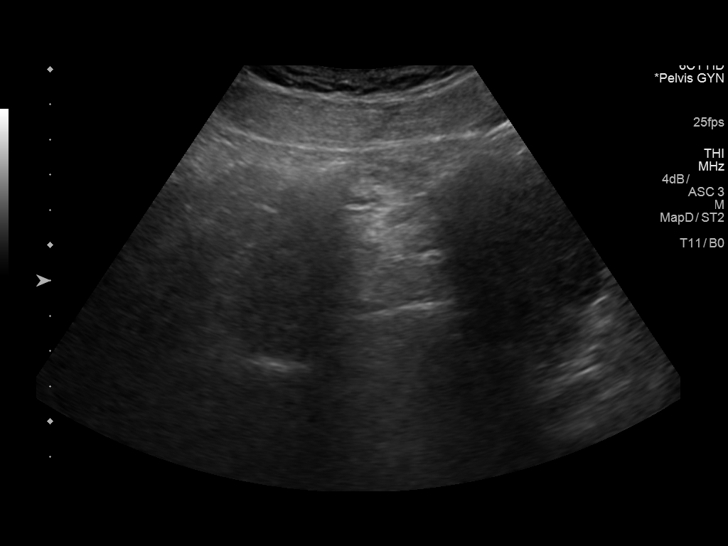
[im 27/80]
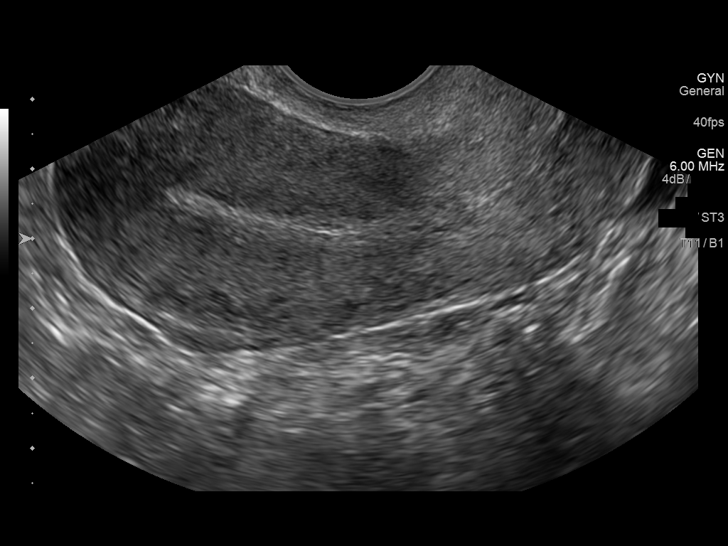
[im 33/80]
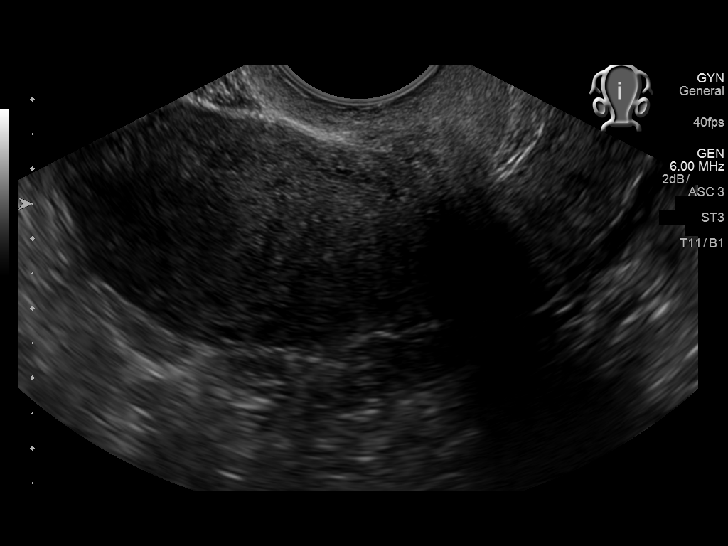
[im 39/80]
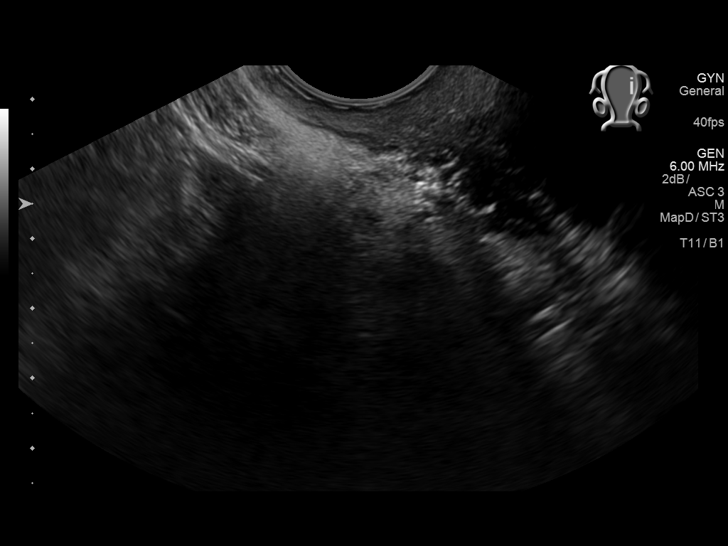
[im 44/80]
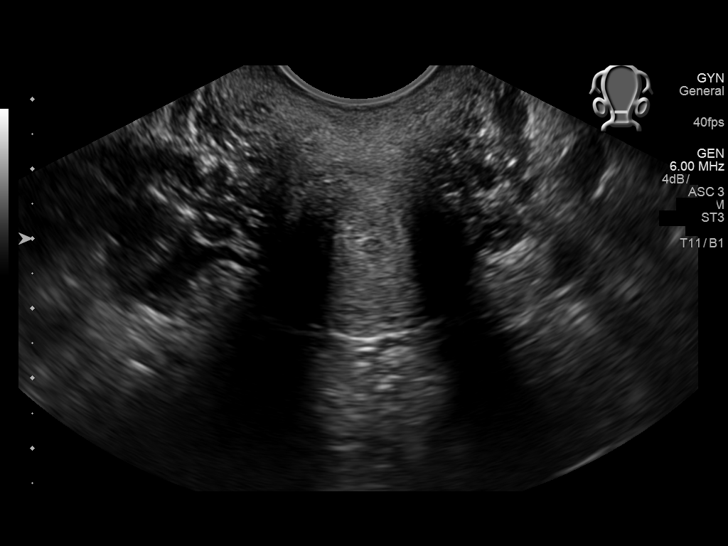
[im 50/80]
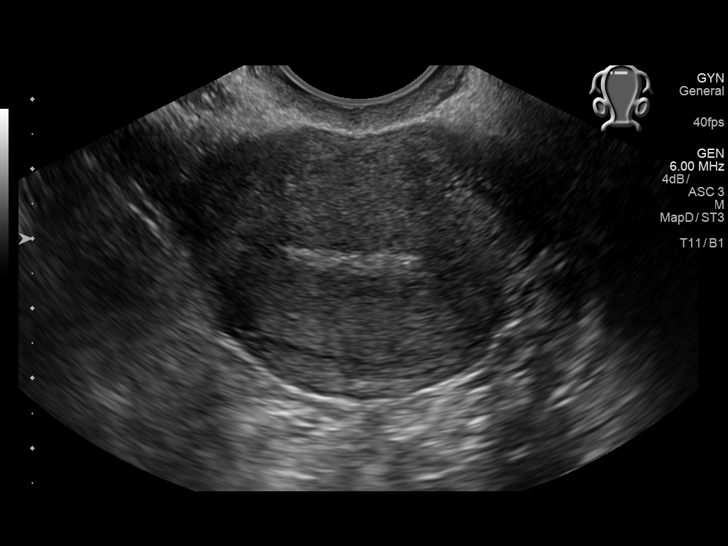
[im 56/80]
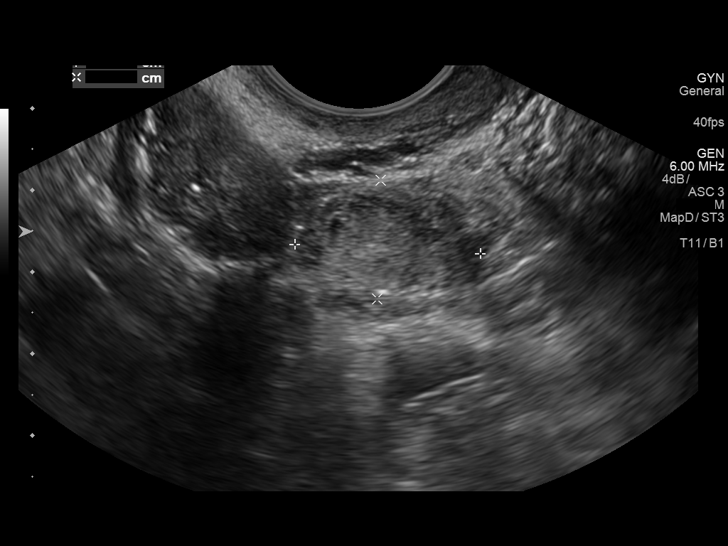
[im 62/80]
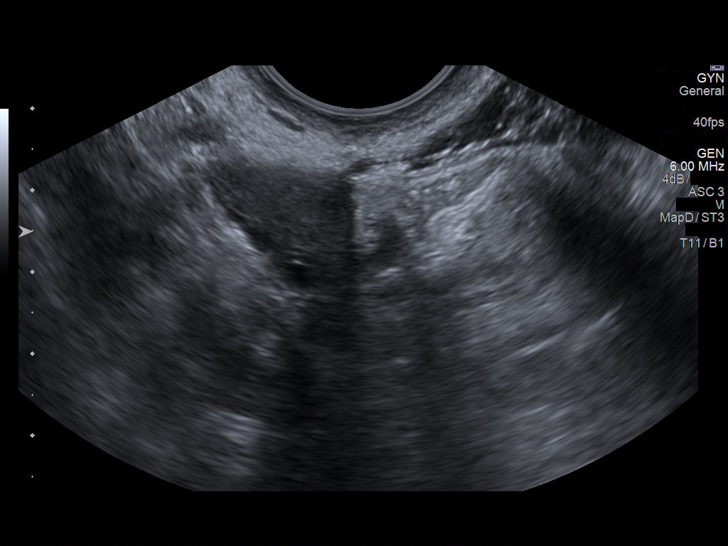
[im 68/80]
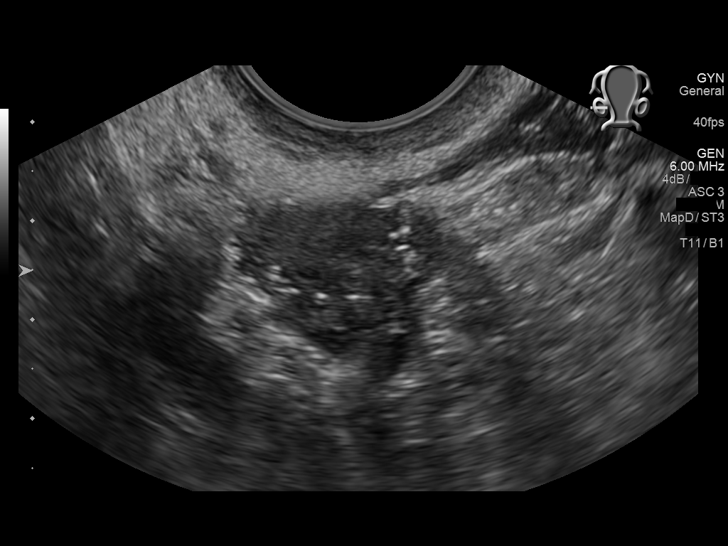
[im 74/80]
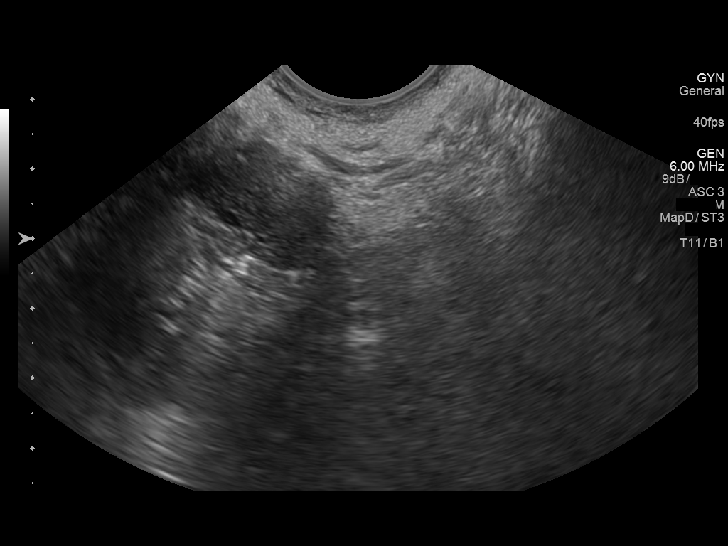
[im 80/80]
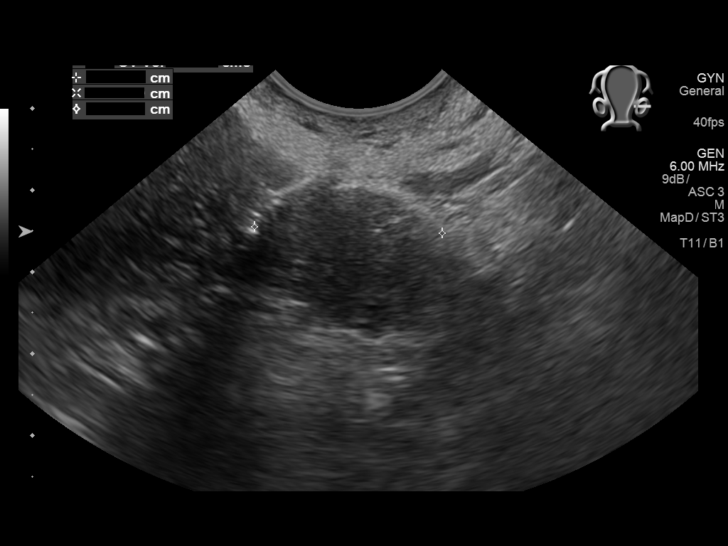

[14 of 28 positions shown; findings below may reference images not displayed]

FINDINGS: No intrauterine gestational sac or fluid. There is an echogenic
peripherally hypervascular mass in the right adnexa that is extra
ovarian, 23 x 15 x 15 mm. Mass has no identifiable internal fetal
structures. Symmetric normal appearance of the ovaries. No free
pelvic fluid. These results were called by telephone at the time of
interpretation on 02/06/2016 at [DATE] to Dr. KLPIGBB MOOLMAN , who
verbally acknowledged these results.
IMPRESSION: Extra ovarian right adnexal mass with no intrauterine sac,
concerning for ectopic pregnancy. At the current serum beta HCG
level, early intrauterine pregnancy cannot be excluded. No pelvic
fluid.

## 2018-11-27 IMAGING — US US OB TRANSVAGINAL
1 series · 13 of 28 positions shown · non-contrast
Comparison: February 06, 2016

CLINICAL DATA: Vaginal bleeding and cramping with positive serum
pregnancy test. Previous right-sided ectopic gestation

EXAM:
OBSTETRIC <14 WK US AND TRANSVAGINAL OB US
TECHNIQUE: Both transabdominal and transvaginal ultrasound examinations were
performed for complete evaluation of the gestation as well as the
maternal uterus, adnexal regions, and pelvic cul-de-sac.
Transvaginal technique was performed to assess early pregnancy.

[Series 1: us ob transvaginal · 0.23mm/px · 102 acquisitions, 13 frames shown]
[im 4/102]
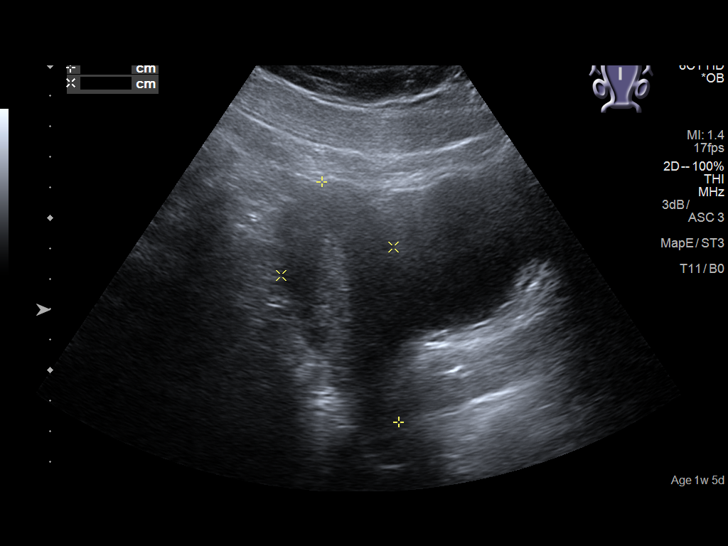
[im 12/102]
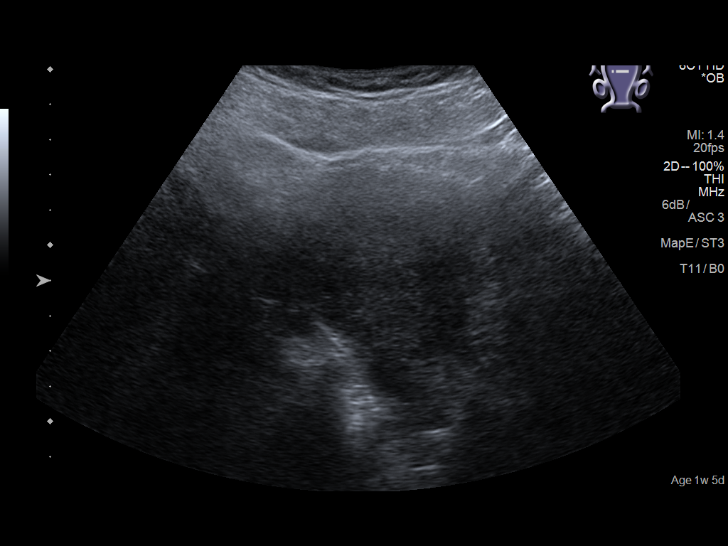
[im 19/102]
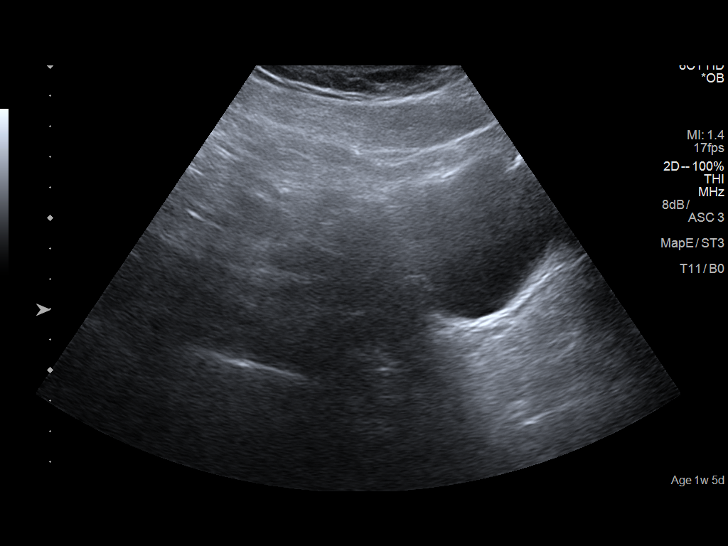
[im 27/102]
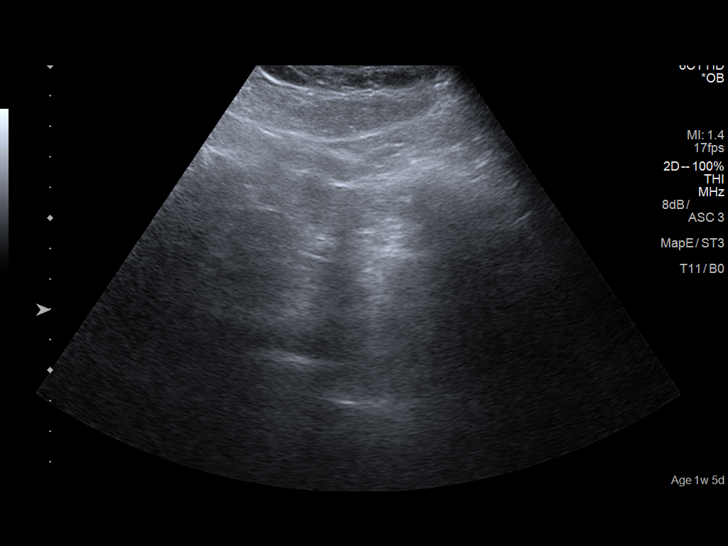
[im 34/102]
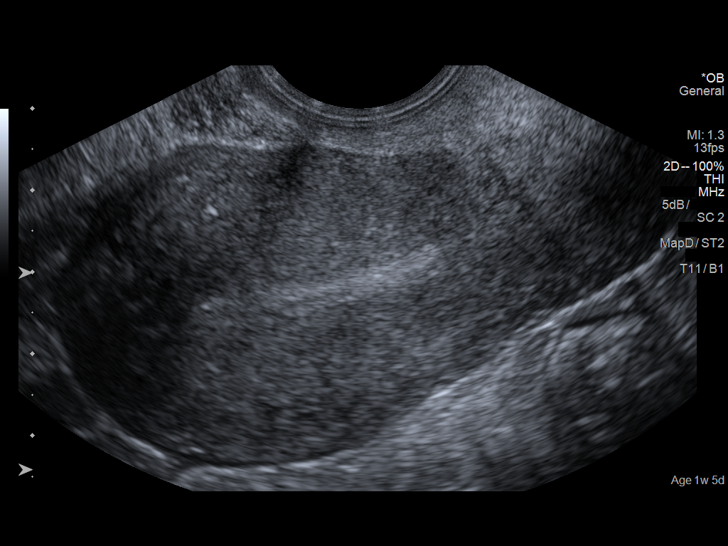
[im 42/102]
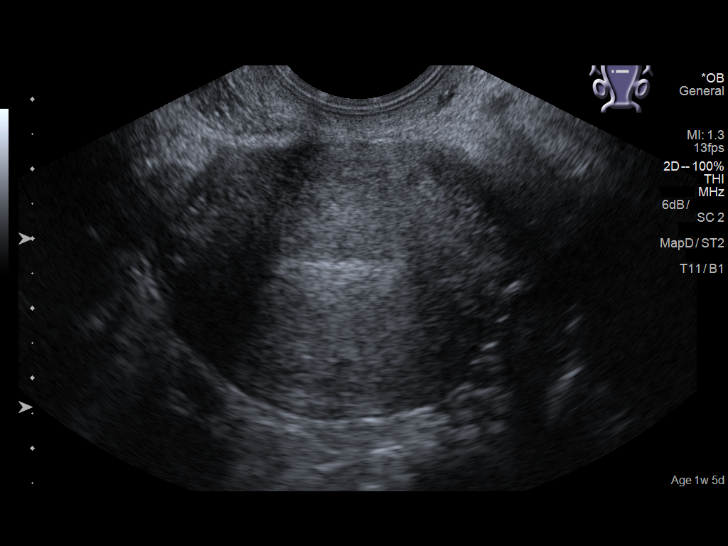
[im 53/102]
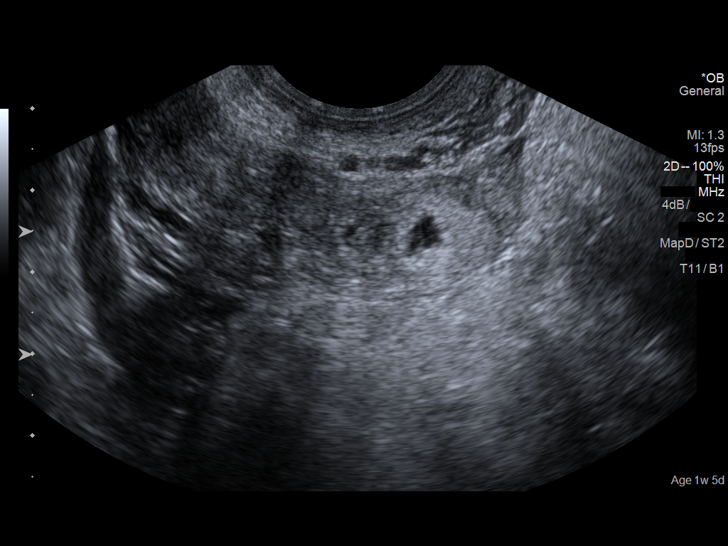
[im 60/102]
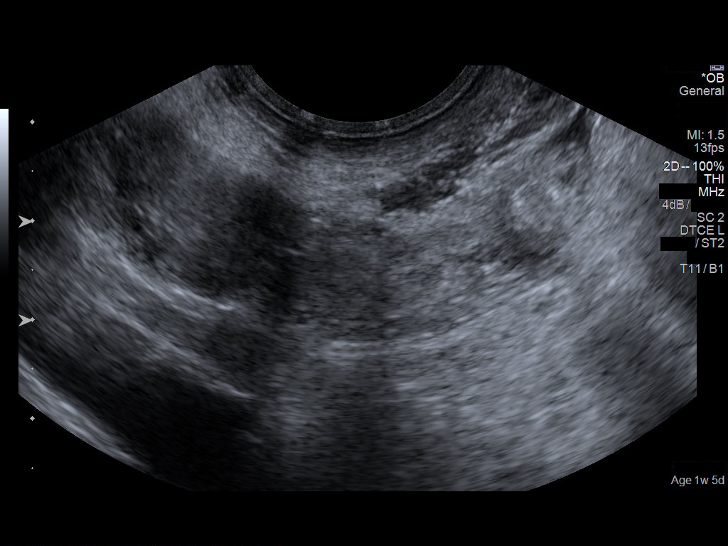
[im 68/102]
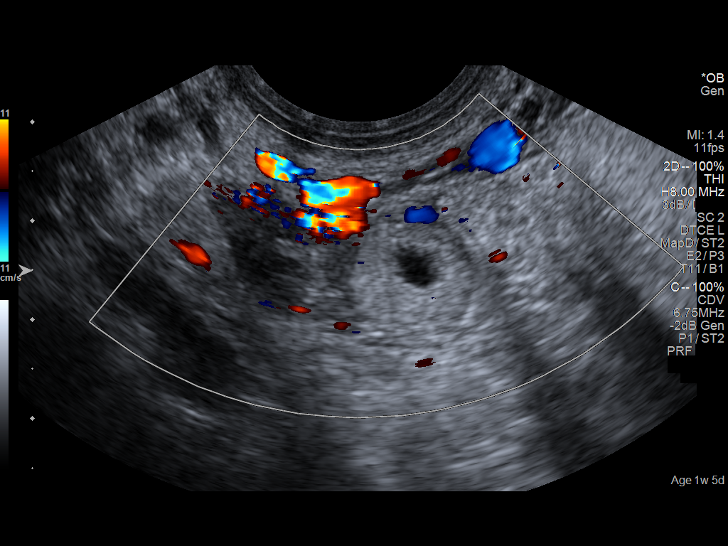
[im 75/102]
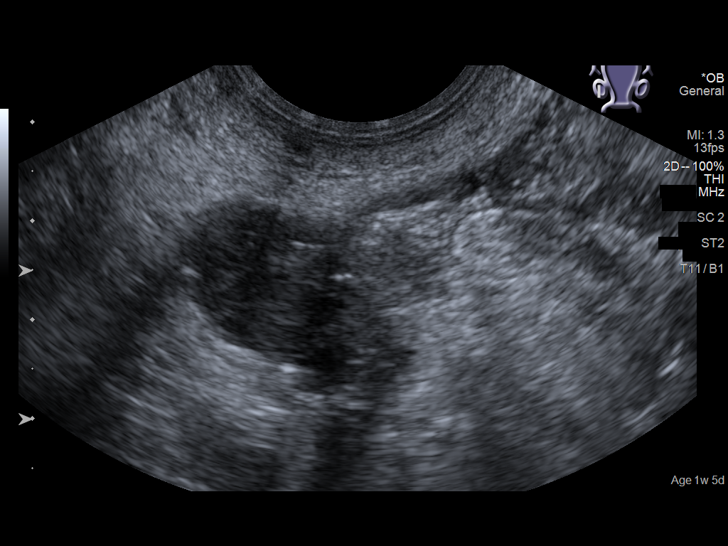
[im 83/102]
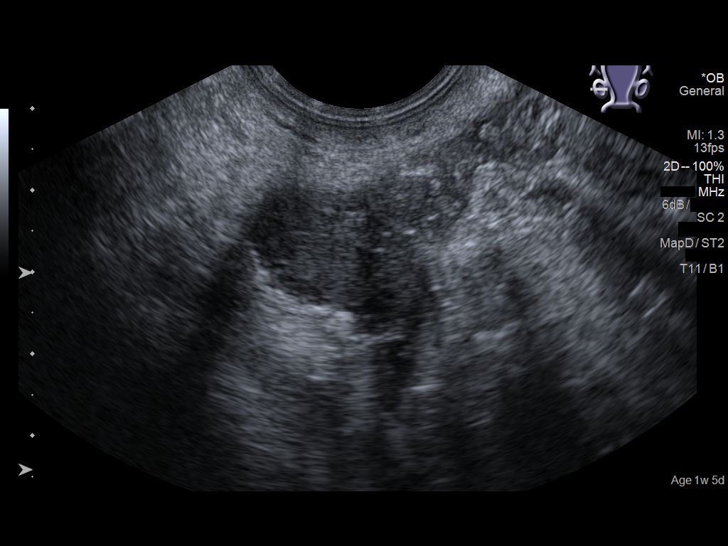
[im 90/102]
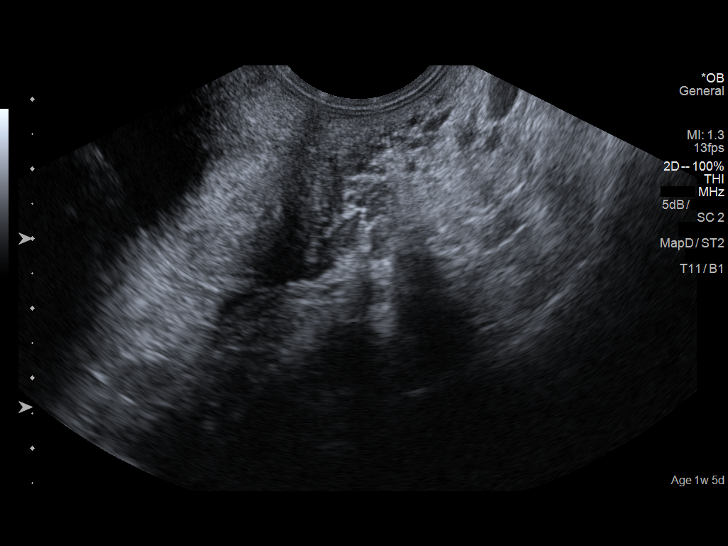
[im 98/102]
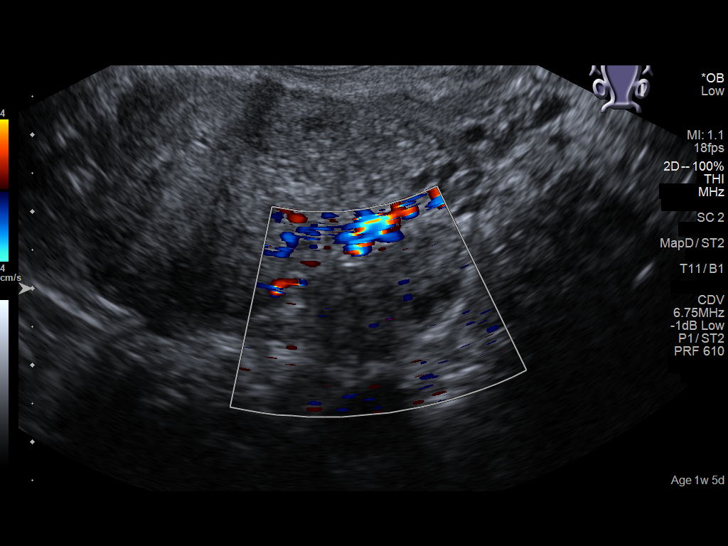

[13 of 28 positions shown; findings below may reference images not displayed]

FINDINGS: Intrauterine gestational sac: Not visualized

Yolk sac:  Not visualized

Embryo:  Not visualized

Cardiac Activity: Not visualized

Subchorionic hemorrhage:  None visualized.

Maternal uterus/ovaries: No intrauterine mass. There is a complex
appearing right adnexal mass which contains a sac-like area
concerning for extrauterine gestation. An extrauterine fetus is
currently not seen. Left ovary appears unremarkable. There is a
small amount of free pelvic fluid.
IMPRESSION: No intrauterine gestation or intrauterine mass. There is a right
adnexal soft tissue mass with sac-like area within this lesion. The
mass measures approximately 1.5 x 1.5 cm in this area. This lesion
is similar to the lesions seen 1 year prior. The appearance is
suspicious for ectopic gestation on the right. Small amount of
nearby free pelvic fluid noted. Note that an actual extrauterine
gestation is not seen. Given this concern for ectopic gestation,
gynecologic consultation is urged.

Critical Value/emergent results were called by telephone at the time
of interpretation on 02/10/2017 at [DATE] to Dr. AYBULAT PILIPENKO ,
who verbally acknowledged these results.
